# Patient Record
Sex: Female | Born: 1940 | Race: White | Hispanic: No | Marital: Married | Smoking: Never smoker
Health system: Southern US, Community
[De-identification: ages and names within clinical notes are randomized; demographics above are authoritative.]

## PROBLEM LIST (undated history)

## (undated) DIAGNOSIS — I10 Essential (primary) hypertension: Secondary | ICD-10-CM

## (undated) DIAGNOSIS — Z0000 Encounter for general adult medical examination without abnormal findings: Secondary | ICD-10-CM

## (undated) DIAGNOSIS — C50919 Malignant neoplasm of unspecified site of unspecified female breast: Secondary | ICD-10-CM

## (undated) HISTORY — DX: Encounter for general adult medical examination without abnormal findings: Z00.00

## (undated) HISTORY — PX: TONSILLECTOMY: SUR1361

## (undated) HISTORY — DX: Essential (primary) hypertension: I10

## (undated) HISTORY — DX: Malignant neoplasm of unspecified site of unspecified female breast: C50.919

## (undated) HISTORY — PX: MASTECTOMY MODIFIED RADICAL: SUR848

---

## 1999-11-19 ENCOUNTER — Other Ambulatory Visit: Admission: RE | Admit: 1999-11-19 | Discharge: 1999-11-19 | Payer: Self-pay | Admitting: *Deleted

## 2003-09-29 ENCOUNTER — Emergency Department (HOSPITAL_COMMUNITY): Admission: EM | Admit: 2003-09-29 | Discharge: 2003-09-29 | Payer: Self-pay | Admitting: Emergency Medicine

## 2003-10-05 ENCOUNTER — Inpatient Hospital Stay (HOSPITAL_COMMUNITY): Admission: RE | Admit: 2003-10-05 | Discharge: 2003-10-06 | Payer: Self-pay | Admitting: Orthopedic Surgery

## 2004-04-23 ENCOUNTER — Other Ambulatory Visit: Admission: RE | Admit: 2004-04-23 | Discharge: 2004-04-23 | Payer: Self-pay | Admitting: *Deleted

## 2004-06-11 ENCOUNTER — Ambulatory Visit: Payer: Self-pay | Admitting: Internal Medicine

## 2005-05-28 ENCOUNTER — Ambulatory Visit: Payer: Self-pay | Admitting: Internal Medicine

## 2005-12-16 ENCOUNTER — Ambulatory Visit: Payer: Self-pay | Admitting: Internal Medicine

## 2006-05-18 ENCOUNTER — Ambulatory Visit: Payer: Self-pay | Admitting: Internal Medicine

## 2007-07-24 ENCOUNTER — Telehealth: Payer: Self-pay | Admitting: Internal Medicine

## 2007-11-27 ENCOUNTER — Ambulatory Visit: Payer: Self-pay | Admitting: Internal Medicine

## 2007-11-27 DIAGNOSIS — C50919 Malignant neoplasm of unspecified site of unspecified female breast: Secondary | ICD-10-CM | POA: Insufficient documentation

## 2007-11-27 DIAGNOSIS — I10 Essential (primary) hypertension: Secondary | ICD-10-CM | POA: Insufficient documentation

## 2007-11-27 DIAGNOSIS — Z853 Personal history of malignant neoplasm of breast: Secondary | ICD-10-CM | POA: Insufficient documentation

## 2007-11-27 LAB — CONVERTED CEMR LAB
Basophils Absolute: 0 10*3/uL (ref 0.0–0.1)
Calcium: 9.3 mg/dL (ref 8.4–10.5)
Cholesterol: 219 mg/dL (ref 0–200)
Direct LDL: 141.4 mg/dL
Eosinophils Absolute: 0.1 10*3/uL (ref 0.0–0.7)
GFR calc Af Amer: 81 mL/min
GFR calc non Af Amer: 67 mL/min
HCT: 41.4 % (ref 36.0–46.0)
MCHC: 34.2 g/dL (ref 30.0–36.0)
MCV: 93.7 fL (ref 78.0–100.0)
Monocytes Absolute: 0.4 10*3/uL (ref 0.1–1.0)
Monocytes Relative: 8.9 % (ref 3.0–12.0)
Neutro Abs: 2.4 10*3/uL (ref 1.4–7.7)
Platelets: 339 10*3/uL (ref 150–400)
Potassium: 3.9 meq/L (ref 3.5–5.1)
RDW: 12.4 % (ref 11.5–14.6)
Sodium: 149 meq/L — ABNORMAL HIGH (ref 135–145)
TSH: 1.56 microintl units/mL (ref 0.35–5.50)
Total CHOL/HDL Ratio: 3.2
Triglycerides: 76 mg/dL (ref 0–149)

## 2007-11-28 ENCOUNTER — Encounter: Payer: Self-pay | Admitting: Internal Medicine

## 2007-11-29 ENCOUNTER — Telehealth: Payer: Self-pay | Admitting: Internal Medicine

## 2008-01-01 ENCOUNTER — Telehealth: Payer: Self-pay | Admitting: Internal Medicine

## 2008-01-22 ENCOUNTER — Ambulatory Visit: Payer: Self-pay | Admitting: Gastroenterology

## 2008-02-05 ENCOUNTER — Ambulatory Visit: Payer: Self-pay | Admitting: Gastroenterology

## 2008-05-31 ENCOUNTER — Ambulatory Visit: Payer: Self-pay | Admitting: Internal Medicine

## 2009-01-27 ENCOUNTER — Ambulatory Visit: Payer: Self-pay | Admitting: Internal Medicine

## 2009-01-27 DIAGNOSIS — E785 Hyperlipidemia, unspecified: Secondary | ICD-10-CM | POA: Insufficient documentation

## 2009-01-27 LAB — CONVERTED CEMR LAB
Calcium: 9.1 mg/dL (ref 8.4–10.5)
Creatinine, Ser: 0.8 mg/dL (ref 0.4–1.2)
GFR calc non Af Amer: 76.13 mL/min (ref >60)
HDL: 61.5 mg/dL (ref >39.00)
Sodium: 146 meq/L — ABNORMAL HIGH (ref 135–145)
TSH: 1.68 microintl units/mL (ref 0.35–5.50)
Triglycerides: 91 mg/dL (ref 0.0–149.0)

## 2009-01-29 ENCOUNTER — Telehealth: Payer: Self-pay | Admitting: Internal Medicine

## 2009-05-15 ENCOUNTER — Ambulatory Visit: Payer: Self-pay | Admitting: Internal Medicine

## 2009-08-28 ENCOUNTER — Encounter: Payer: Self-pay | Admitting: Internal Medicine

## 2009-09-29 ENCOUNTER — Telehealth: Payer: Self-pay | Admitting: Internal Medicine

## 2010-09-01 NOTE — Progress Notes (Signed)
    Preventive Care Screening  Bone Density:    Date:  08/28/2009    Results:  normal

## 2010-09-18 ENCOUNTER — Ambulatory Visit (INDEPENDENT_AMBULATORY_CARE_PROVIDER_SITE_OTHER): Payer: Medicare Other | Admitting: Internal Medicine

## 2010-09-18 ENCOUNTER — Encounter (INDEPENDENT_AMBULATORY_CARE_PROVIDER_SITE_OTHER): Payer: Self-pay | Admitting: *Deleted

## 2010-09-18 ENCOUNTER — Encounter: Payer: Self-pay | Admitting: Internal Medicine

## 2010-09-18 ENCOUNTER — Other Ambulatory Visit: Payer: Self-pay | Admitting: Internal Medicine

## 2010-09-18 ENCOUNTER — Other Ambulatory Visit: Payer: Medicare Other

## 2010-09-18 DIAGNOSIS — Z2911 Encounter for prophylactic immunotherapy for respiratory syncytial virus (RSV): Secondary | ICD-10-CM

## 2010-09-18 DIAGNOSIS — E785 Hyperlipidemia, unspecified: Secondary | ICD-10-CM

## 2010-09-18 DIAGNOSIS — I10 Essential (primary) hypertension: Secondary | ICD-10-CM

## 2010-09-18 DIAGNOSIS — Z0000 Encounter for general adult medical examination without abnormal findings: Secondary | ICD-10-CM

## 2010-09-18 DIAGNOSIS — Z23 Encounter for immunization: Secondary | ICD-10-CM

## 2010-09-18 LAB — LDL CHOLESTEROL, DIRECT: Direct LDL: 133.8 mg/dL

## 2010-09-18 LAB — LIPID PANEL
HDL: 69.6 mg/dL (ref >39.00)
Triglycerides: 127 mg/dL (ref 0.0–149.0)
VLDL: 25.4 mg/dL (ref 0.0–40.0)

## 2010-09-18 LAB — BASIC METABOLIC PANEL
BUN: 15 mg/dL (ref 6–23)
Calcium: 9.4 mg/dL (ref 8.4–10.5)
GFR: 69.69 mL/min (ref >60.00)
Glucose, Bld: 86 mg/dL (ref 70–99)
Potassium: 5 mEq/L (ref 3.5–5.1)
Sodium: 141 mEq/L (ref 135–145)

## 2010-09-29 NOTE — Assessment & Plan Note (Signed)
Summary: yearly wellness exam/fasting   Vital Signs:  Patient profile:   70 year old female Height:      61.50 inches Weight:      162.50 pounds BMI:     30.32 O2 Sat:      98 % on Room air Temp:     98.2 degrees F oral Pulse rate:   78 / minute Resp:     14 per minute BP sitting:   158 / 92  (right arm) Cuff size:   regular  Vitals Entered By: Burnard Leigh CMA(AAMA) (September 18, 2010 10:42 AM)  O2 Flow:  Room air CC: Yearly wellness exam/medicare..sls,cma Is Patient Diabetic? No Comments Pt would like to discuss Zostavax/sls,cma   Primary Care Provider:  [facility]  CC:  Yearly wellness exam/medicare..sls and cma.  History of Present Illness: Sharon Zavala presenting for  medicare wellness exam. She saw her gynecologist, Esperanza Richters, on Valentine's Day - and had a normal exam: pap smear, breast exam - s/p mastectomy with prosthesis.  She is feeling well except for two episdoes of blurred vision that lasted 10-15 minutes resolving with rest.   She is 100% independent in all activities of daily living. No falls or increased risk of falls. She did fall in '05 but not since. She is cognitively intact: socially acitve, running the household and paying the bills. No signs or symptoms of depression.   Preventive Screening-Counseling & Management  Alcohol-Tobacco     Alcohol drinks/day: 0     Smoking Status: never  Caffeine-Diet-Exercise     Caffeine use/day: 3 cups daily     Diet Comments: regular     Does Patient Exercise: yes     Type of exercise: walk     Exercise (avg: min/session): <30     Times/week: 4  Hep-HIV-STD-Contraception     Hepatitis Risk: no risk noted     HIV Risk: no risk noted     STD Risk: no risk noted     Dental Visit-last 6 months yes     Sun Exposure-Excessive: yes  Safety-Violence-Falls     Seat Belt Use: yes     Helmet Use: n/a     Firearms in the Home: firearms in the home     Smoke Detectors: yes     Violence in the Home: not  applicable     Sexual Abuse: no     Fall Risk: no      Sexual History:  currently monogamous.        Drug Use:  never.        Blood Transfusions:  no.    Current Medications (verified): 1)  Multivitamins   Tabs (Multiple Vitamin) .... Take 1 Tablet By Mouth Once A Day 2)  Calcium-D 600-200 Mg-Unit  Tabs (Calcium Carbonate-Vitamin D) .... Take 1 Tablet By Mouth Once A Day 3)  Enalapril-Hydrochlorothiazide 5-12.5 Mg  Tabs (Enalapril-Hydrochlorothiazide) .Marland Kitchen.. 1 By Mouth Once Daily 4)  Klor-Con 8 Meq  Tbcr (Potassium Chloride) .Marland Kitchen.. 1 By Mouth Once Daily 5)  Adult Aspirin Low Strength 81 Mg  Tbdp (Aspirin) .... 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 '89 Mastectomy-modified radical right with saline implant reconst. '  72   G1P1  Family History: Last updated: 12/13/2007 Father- deceased @88 : Prostate cancer, HTN Mother - deceased @96 : natural sister- breast cancer Neg- colon cancer, DM, CAD  Social History: Last updated: Dec 13, 2007 [employer] college- BA english married '67 1 daughter - '68: 1 grandchild Lives with husband - independently. He has made a good recovery from prostate surgery- '09  Social History: Caffeine use/day:  3 cups daily Does Patient Exercise:  yes Fall Risk:  no Seat Belt Use:  yes Dental Care w/in 6 mos.:  yes Hepatitis Risk:  no risk noted HIV Risk:  no risk noted STD Risk:  no risk noted Sun Exposure-Excessive:  yes Sexual History:  currently monogamous Drug Use:  never Blood Transfusions:  no  Review of Systems       The patient  complains of suspicious skin lesions.  The patient denies anorexia, fever, weight loss, weight gain, vision loss, decreased hearing, hoarseness, chest pain, dyspnea on exertion, peripheral edema, prolonged cough, headaches, abdominal pain, severe indigestion/heartburn, hematuria, incontinence, genital sores, muscle weakness, difficulty walking, depression, unusual weight change, abnormal bleeding, enlarged lymph nodes, angioedema, and breast masses.         concerned for macular skin darkening at the right jaw.   Physical Exam  General:  Well-developed,well-nourished,in no acute distress; alert,appropriate and cooperative throughout examination Head:  Normocephalic and atraumatic without obvious abnormalities. No apparent alopecia or balding. Eyes:  No corneal or conjunctival inflammation noted. EOMI. Perrla. Funduscopic exam benign, without hemorrhages, exudates or papilledema. Vision grossly normal. Ears:  External ear exam shows no significant lesions or deformities.  Otoscopic examination reveals clear canals, tympanic membranes are intact bilaterally without bulging, retraction, inflammation or discharge. Hearing is grossly normal bilaterally. Nose:  no external deformity and no external erythema.   Mouth:  Oral mucosa and oropharynx without lesions or exudates.  Teeth in good repair. Neck:  supple, full ROM, no thyromegaly, and no carotid bruits.   Breasts:  deferred to oncology and general surgery: s/p bilateral mastectomies Lungs:  Normal respiratory effort, chest expands symmetrically. Lungs are clear to auscultation, no crackles or wheezes. Heart:  Normal rate and regular rhythm. S1 and S2 normal without gallop, murmur, click, rub or other extra sounds. Abdomen:  soft, non-tender, no distention, no guarding, and no hepatomegaly.   Genitalia:  deferred to gyn Msk:  normal ROM, no joint tenderness, no joint swelling, no joint warmth, no redness over joints, and no joint instability.     Pulses:  2+ radial and DP pulses Extremities:  No clubbing, cyanosis, edema, or deformity noted with normal full range of motion of all joints.   Neurologic:  alert & oriented X3, cranial nerves II-XII intact, strength normal in all extremities, gait normal, and DTRs symmetrical and normal.   Skin:  turgor normal, color normal, and no rashes.   Cervical Nodes:  no anterior cervical adenopathy and no posterior cervical adenopathy.   Psych:  Oriented X3, memory intact for recent and remote, normally interactive, and good eye contact.     Impression & Recommendations:  Problem # 1:  HYPERLIPIDEMIA (ICD-272.4) Due for lab with recommendations to follow.  Orders: TLB-Lipid Panel (80061-LIPID)  Addendum: HDL is robust and protectve; LDL is just above goal of 161 or less (NCEP ATP III)  Plan - a prudent diet and exercise.  Problem # 2:  UNSPECIFIED ESSENTIAL HYPERTENSION (ICD-401.9)  Her updated medication list for this problem includes:    Enalapril-hydrochlorothiazide 5-12.5 Mg Tabs (Enalapril-hydrochlorothiazide) .Marland Kitchen... 1 by mouth  once daily  Orders: TLB-BMP (Basic Metabolic Panel-BMET) (80048-METABOL)  BP today: 158/92 Prior BP: 112/84 (01/27/2009)  Elevated BP at today's visit. Previous readings were excellent.  PLan - home monitoring of Blood Pressure: report back, escpecially if systolic BP is consistently 140+.  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 She is doing great with no signs of recurrent disease.  Problem # 4:  Preventive Health Care (ICD-V70.0) Bengin interval history. Physical exam is normal except for elevation in BP. Lab results are within normal limits. She is current with colorectal cancer screening with last study July '09, due for follow-up in 2019. Immunizations: Tetnus and shingles vaccine administered today; Pneumonia vaccine June '10; Fl Oct '11. 12 lead EKG without signs of  ischemia or injury.   In summary- a very nice woman who appears to be medically stable. She will return in one year or as needed. She will report back out-of-office blood pressure readings, particularly if SBP is 140+ on a regular basis.   Complete Medication List: 1)  Multivitamins Tabs (Multiple vitamin) .... Take 1 tablet by mouth once a day 2)  Calcium-d 600-200 Mg-unit Tabs (Calcium carbonate-vitamin d) .... Take 1 tablet by mouth once a day 3)  Enalapril-hydrochlorothiazide 5-12.5 Mg Tabs (Enalapril-hydrochlorothiazide) .Marland Kitchen.. 1 by mouth once daily 4)  Klor-con 8 Meq Tbcr (Potassium chloride) .Marland Kitchen.. 1 by mouth once daily 5)  Adult Aspirin Low Strength 81 Mg Tbdp (Aspirin) .... Take 1 tablet by mouth once a day  Other Orders: Tdap => 59yrs IM (78469) Admin 1st Vaccine (62952) Zoster (Shingles) Vaccine Live [phone]) Admin of Any Addtl Vaccine (44010) Medicare -1st Annual Wellness Visit [id number])   Patient: Sharon Zavala Note: All result statuses are Final unless otherwise noted.  Tests: (1) Lipid Panel (LIPID)   Cholesterol          [H]  224 mg/dL                   6-644     ATP III Classification            Desirable:  < 200 mg/dL                    Borderline High:  200 - 239 mg/dL               High:  > = 240 mg/dL   Triglycerides             127.0 mg/dL                 0.3-474.2     Normal:  <150 mg/dL     Borderline High:  595 - 199 mg/dL   HDL                       63.87 mg/dL                 >56.43   VLDL Cholesterol          25.4 mg/dL                  3.2-95.1  CHO/HDL Ratio:  CHD Risk                             3  Men          Women     1/2 Average Risk     3.4          3.3     Average Risk          5.0          4.4     2X Average Risk          9.6          7.1     3X Average Risk          15.0          11.0                           Tests: (2) BMP (METABOL)   Sodium                    141 mEq/L                   135-145   Potassium                  5.0 mEq/L                   3.5-5.1   Chloride                  104 mEq/L                   96-112   Carbon Dioxide            31 mEq/L                    19-32   Glucose                   86 mg/dL                    16-10   BUN                       15 mg/dL                    9-60   Creatinine                0.9 mg/dL                   4.5-4.0   Calcium                   9.4 mg/dL                   9.8-11.9   GFR                       69.69 mL/min                >60.00  Tests: (3) Cholesterol LDL - Direct (DIRLDL)  Cholesterol LDL - Direct                             133.8 mg/dL     Optimal:  <147 mg/dL     Near or Above Optimal:  100-129 mg/dL     Borderline High:  829-562 mg/dL     High:  130-865 mg/dL  Very High:  >190 mg/dL  Orders Added: 1)  Tdap => 59yrs IM [90715] 2)  Admin 1st Vaccine [90471] 3)  Zoster (Shingles) Vaccine Live [90736] 4)  Admin of Any Addtl Vaccine [90472] 5)  Medicare -1st Annual Wellness Visit [G0438] 6)  Est. Patient Level II [16109] 7)  TLB-Lipid Panel [80061-LIPID] 8)  TLB-BMP (Basic Metabolic Panel-BMET) [80048-METABOL]   Immunization History:  Influenza Immunization History:    Influenza:  historical (05/02/2010)  Immunizations Administered:  Tetanus Vaccine:    Vaccine Type: Tdap    Site: right deltoid    Mfr: GlaxoSmithKline    Dose: 0.5 ml    Route: IM    Given by: Burnard Leigh CMA(AAMA)    Exp. Date: 05/21/2012    Lot #: [id number]    VIS given: 06/19/08 version given September 18, 2010.  Zostavax # 1:    Vaccine Type: Zostavax    Site: right deltoid    Mfr: Merck    Dose: 0.5 ml    Route: [place]    Given by: Burnard Leigh CMA(AAMA)    Exp. Date: 03/20/2011    Lot #: [id number]    VIS given: 05/14/05 given September 18, 2010.   Immunization History:  Influenza Immunization History:    Influenza:  Historical (05/02/2010)  Immunizations Administered:  Tetanus Vaccine:    Vaccine Type: Tdap    Site: right deltoid     Mfr: GlaxoSmithKline    Dose: 0.5 ml    Route: IM    Given by: Burnard Leigh CMA(AAMA)    Exp. Date: 05/21/2012    Lot #: [id number]    VIS given: 06/19/08 version given September 18, 2010.  Zostavax # 1:    Vaccine Type: Zostavax    Site: right deltoid    Mfr: Merck    Dose: 0.5 ml    Route: [place]    Given by: Burnard Leigh CMA(AAMA)    Exp. Date: 03/20/2011    Lot #: [id number]    VIS given: 05/14/05 given September 18, 2010.

## 2010-12-18 NOTE — Op Note (Signed)
NAMEKANON, COLUNGA                       ACCOUNT NO.:  [id number]   MEDICAL RECORD NO.:  [id number]                   PATIENT TYPE:  INP   LOCATION:  5005                                 FACILITY:  [facility]   PHYSICIAN:  Dionne Ano. Everlene Other, M.D.         DATE OF BIRTH:  09/04/1940   DATE OF PROCEDURE:  10/04/2003  DATE OF DISCHARGE:  10/06/2003                                 OPERATIVE REPORT   Dictator wants a copy of the previously dictated report for this patient  sent to his office in [place] and Dr. Jene Every at [facility].                                               Dionne Ano. Everlene Other, M.D.    Nash Mantis  D:  10/05/2003  T:  10/06/2003  Job:  [id number]   cc:   Jene Every, M.D.  [address]  [place]  [place] [address]  Fax: [phone]

## 2010-12-18 NOTE — Op Note (Signed)
Sharon, Zavala                       ACCOUNT NO.:  [id number]   MEDICAL RECORD NO.:  [id number]                   PATIENT TYPE:  INP   LOCATION:  5005                                 FACILITY:  [facility]   PHYSICIAN:  Dionne Ano. Everlene Other, M.D.         DATE OF BIRTH:  1941/06/27   DATE OF PROCEDURE:  DATE OF DISCHARGE:  10/06/2003                                 OPERATIVE REPORT   PREOPERATIVE DIAGNOSES:  Terrible triad-type elbow fracture, right upper  extremity with marked displacement and comminution of the radial head  fracture with dislocation of the radial head and comminuted olecranon  fracture.   POSTOPERATIVE DIAGNOSES:  Terrible triad-type elbow fracture, right upper  extremity with marked displacement and comminution of the radial head  fracture with dislocation of the radial head and comminuted olecranon  fracture.   PROCEDURE:  1. Open reduction internal fixation with congruent plate system, right     olecranon fracture.  2. Radial head excision, right elbow.  3. Arthroplasty right elbow radial head with 20 small head and size 1 stem     from [employer].  4. Stress radiography.  5. Repair lateral ulnar collateral ligament complex, right elbow.   SURGEON:  Dionne Ano. Amanda Pea, M.D.   ASSISTANT:  Dr. Jene Every.   ANESTHESIA:  General.   DRAINS:  One.   COMPLICATIONS:  None.   INDICATIONS FOR THE PROCEDURE:  This patient is a very pleasant female who  presents with the above-mentioned diagnosis.  She has an injury mechanism of  a terrible triad fracture of the elbow with complete dislocation of the  radial head, comminuted olecranon fracture and lateral elbow instability.  I  have discussed the risks and benefits of the operative procedure in detail  and all questions have been encouraged and answered preoperatively.  The  operative risks include death, infection, anesthesia, damage to normal  structures, failure of surgery to accomplish  its intended goals, __________  return to function, heterotopic ossification, stiffness, chronic pain and  dystrophy as some of the major complicating features which can occur.  I  have also discussed her possible need for hardware removal, revision, etc.  With this in mind, she is asked to proceed.   OPERATIVE PROCEDURE IN DETAIL:  The patient received satisfactory general  anesthesia, taken to the operative suite, was counseled in the holding area.  The correct extremity to be operated on was identified.  She was then taken  to the operative suite, underwent a general anesthetic, was well padded,  appropriately positioned in the semi lateral position.  A U-drape was placed  and I carefully removed the splint, applied a tourniquet and then performed  Betadine scrub and paint, 10 minute nature.  Following this, a sterile field  was secured.  Ancef was given preoperatively.  Once this was done, the arm  was elevated.  The tourniquet was used to inflate to 250 mmHg and a  posterior  utilitarian incision was made.  Flaps were created.  A large flap  was swung laterally about the elbow to access the Kocher interval and  posteriorly I could access the ulna.  With this performed, I then incised  the periosteum overlying the olecranon and ulna and identified the fracture  fragments.  The fracture fragments were treated with debridement, curettage,  irrigation and provisional fixation was then obtained followed by the  application of an [employer] congruent bone plate for the ulna.  I was able to  achieve purchase under live fluoroscopy into the coronoid piece.  This plate  reduced the fragments nicely.  I was able to get three screws into the  fragment that harnessed the coronoid anteriorly.  I was able to get at least  six cortices distal to this region as well and I was extremely pleased with  the plate and screw construct.  I then placed the arm under live fluoroscopy  and was pleased with the  stability.  Following this, we then accessed the  Kocher interval between the anconeus and ACU, opened this up and identified  the radial head which was extruded in the soft tissues.  I removed the  radial head, obtained bone graft from the radial head and then placed this  in the olecranon fracture site.  The radial head was examined and it was  felt intraoperatively that the patient would be best served with a radial  head prosthesis.  She did have elements of posterior lateral instability on  posterior pivot shift test in the operative suite as demonstrated by  supinating the arm and allowing range of motion to occur into extension.  When this was performed, the patient had a lateral instability.  Thus, it  was apparent the patient needed a radial head prosthesis as well as repair  of the lateral ulnar collateral ligament/tightening of this structure.  At  this point in time, I then prepared the radial head prosthesis by sizing the  patient to a small stem length and then making an oscillating saw cut under  protection, keeping the arm pronated to avoid injury to the posterior  interosseous nerve.  This was done to my satisfaction without difficulty.  A  small stem size was chosen as the patient had a fairly small canal.  The  patient's radial shaft was fairly mobile and the annular ligament was  released during the dissection and was attenuated with the injury, of  course.  It was of course, later repaired.  With the ascension trial  prosthesis, I felt the patient would be most secure with the small stem and  smallest head, which corresponded to the head removed from the patient per  templating intraoperatively.  Following this, the radial shaft underwent  preparation with broaching followed by placement of a size 1 stem and then  application of a 20 S head.  This reduced nicely.  I was pleased with this  and the findings.  The prosthesis sat down on the bone well and there were no  complicating features.  At this point in time, I performed a stress  radiography.  I was pleased with the range of motion and stability.  I then  performed a repair of the annular ligament and I shored up the lateral ulnar  collateral ligament complex with 2-0 Fib[place]ire to my satisfaction without  difficulty.  This created extra stability.  I then performed a  posterolateral pivot shift test in the operative suite and was pleased  with  the stability.  She was stable to full extension and full flexion and was  stable in supination as well however, we will of course, keep her in a  pronated position during the early postoperative period.  Stress radiography  was performed and final copy x-rays were made, documenting the correct  position of the prostheses and the olecranon implant.  I then placed radial  head bone graft harvested from the radial head excised into the olecranon  fracture site.  I then repaired the anconeus interval with 0 Vicryl about  the anconeus ACU interval and then repaired the periosteum over the ulna and  olecranon to my satisfaction without difficulty.  Following this, the  tourniquet was deflated.  Hemostasis was obtained.  Copious irrigation was  applied and I should note irrigation was applied during multiple portions  throughout the procedure.  While the patient had the radial head  arthroplasty performed, we did copiously irrigate the joint to try and  prevent heterotopic ossification.  We were also very careful with the  oscillating saw so as to not spread any bone dust.  I irrigated the joint  copiously before, during and after the radial head arthroplasty implantation  to try and prevent heterotopic ossification and also had the patient on  prophylactic Indocin.  I have discussed with the patient the need for this  and possible heterotopic ossification risks.   Once this was done and the bone grafting was completed about the olecranon,  the patient then had an  0.8 inch/medium Hemovac drain placed.  This was  hooked up to suction.  The patient then had the posterior incision closed  with 2-0 Vicryl followed by a staple gun at the skin edge.  Marcaine with  epinephrine was placed in the wound for postoperative analgesia and she had  a long arm plaster splint placed with the forearm in pronated position and  the elbow at 90 to 80 degrees of flexion.  She tolerated this well, had  excellent refill of soft compartments, was transferred to the recovery room  in stable condition.  Once in the recovery room, her neurovascular  examination was excellent.  She had good extension of the fingers and no  complicating features.  I have discussed all findings with the family.  We  will proceed accordingly with postoperative measures including IV  antibiotics, pain management.  We will begin therapy at 8-10 days with full  flexion and extension to 30 degrees with the forearm in a pronated position for the first two to three weeks and then we will move her into a  gradual neutral position and then into supination after four weeks.  I have  discussed with the family these issues, postoperative measures, etc.  It has  been a pleasure to participate in her care and we look forward to  participating in her postoperative recovery.                                               Dionne Ano. Everlene Other, M.D.    Sharon Zavala  D:  10/05/2003  T:  10/07/2003  Job:  [id number]

## 2011-02-26 ENCOUNTER — Other Ambulatory Visit: Payer: Self-pay | Admitting: Internal Medicine

## 2011-05-30 ENCOUNTER — Other Ambulatory Visit: Payer: Self-pay | Admitting: Internal Medicine

## 2011-05-31 NOTE — Telephone Encounter (Signed)
Done

## 2011-09-21 ENCOUNTER — Encounter: Payer: Self-pay | Admitting: Internal Medicine

## 2011-09-21 ENCOUNTER — Ambulatory Visit (INDEPENDENT_AMBULATORY_CARE_PROVIDER_SITE_OTHER): Payer: Medicare Other | Admitting: Internal Medicine

## 2011-09-21 ENCOUNTER — Other Ambulatory Visit (INDEPENDENT_AMBULATORY_CARE_PROVIDER_SITE_OTHER): Payer: Medicare Other

## 2011-09-21 VITALS — BP 128/82 | HR 81 | Temp 97.8°F | Resp 16 | Ht 62.0 in | Wt 163.8 lb

## 2011-09-21 DIAGNOSIS — E785 Hyperlipidemia, unspecified: Secondary | ICD-10-CM

## 2011-09-21 DIAGNOSIS — I10 Essential (primary) hypertension: Secondary | ICD-10-CM

## 2011-09-21 DIAGNOSIS — Z0000 Encounter for general adult medical examination without abnormal findings: Secondary | ICD-10-CM

## 2011-09-21 DIAGNOSIS — C50919 Malignant neoplasm of unspecified site of unspecified female breast: Secondary | ICD-10-CM

## 2011-09-21 LAB — COMPREHENSIVE METABOLIC PANEL
Alkaline Phosphatase: 82 U/L (ref 39–117)
CO2: 30 mEq/L (ref 19–32)
Creatinine, Ser: 0.9 mg/dL (ref 0.4–1.2)
GFR: 65.1 mL/min (ref >60.00)
Glucose, Bld: 91 mg/dL (ref 70–99)
Sodium: 142 mEq/L (ref 135–145)
Total Bilirubin: 0.6 mg/dL (ref 0.3–1.2)

## 2011-09-21 MED ORDER — POTASSIUM CHLORIDE ER 8 MEQ PO TBCR: 8.0000 meq | EXTENDED_RELEASE_TABLET | Freq: Every day | ORAL | Status: DC

## 2011-09-21 MED ORDER — ENALAPRIL-HYDROCHLOROTHIAZIDE 5-12.5 MG PO TABS: 1.0000 | ORAL_TABLET | Freq: Every day | ORAL | Status: DC

## 2011-09-21 NOTE — Progress Notes (Signed)
Subjective:    Patient ID: Sharon Zavala, female    DOB: [birth year], 71 y.o.   MRN: [id number]  HPI The patient is here for annual Medicare wellness examination and management of other chronic and acute problems. She reports that she is doing well and feels "great."   The risk factors are reflected in the social history.  The roster of all physicians providing medical care to patient - is listed in the Snapshot section of the chart.  Activities of daily living:  The patient is 100% inedpendent in all ADLs: dressing, toileting, feeding as well as independent mobility  Home safety : The patient has smoke detectors in the home. Falls - house is fall safe. Grab bars are recommended. They wear seatbelts. No firearms at homeThere is no violence in the home.   There is no risks for hepatitis, STDs or HIV. There is no history of blood transfusion. They have no travel history to infectious disease endemic areas of the world.  The patient has seen their dentist in the last six month. They have seen their eye doctor in the last year. They deny any hearing difficulty and have not had audiologic testing in the last year.  They do not  have excessive sun exposure. Discussed the need for sun protection: hats, long sleeves and use of sunscreen if there is significant sun exposure.   Diet: the importance of a healthy diet is discussed. They do have a healthy diet.  The patient has a regular exercise program: walking ,  1 hr duration, 5-7 per week.  The benefits of regular aerobic exercise were discussed.  Depression screen: there are no signs or vegative symptoms of depression- irritability, change in appetite, anhedonia, sadness/tearfullness.  Cognitive assessment: the patient manages all their financial and personal affairs and is actively engaged.  The following portions of the patient's history were reviewed and updated as appropriate: allergies, current medications, past family history, past  medical history,  past surgical history, past social history  and problem list.  Vision, hearing, body mass index were assessed and reviewed.   During the course of the visit the patient was educated and counseled about appropriate screening and preventive services including : fall prevention , diabetes screening, nutrition counseling, colorectal cancer screening, and recommended immunizations.  Past Medical History  Diagnosis Date  . 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 Unspecified essential hypertension   . Routine general medical examination at a health care facility    Past Surgical History  Procedure Date  . Tonsillectomy   . Mastectomy modified radical '89 & '97    Bilateral   Family History  Problem Relation Age of Onset  . Prostate cancer Father 10    deceased  . Other Mother 3    natural causes  . Breast cancer Sister   . Colon cancer Neg Hx   . Diabetes Neg Hx   . Coronary artery disease Neg Hx    History   Social History  . Marital Status: Married    Spouse Name: N/A    Number of Children: N/A  . Years of Education: N/A   Occupational History  . Not on file.   Social History Main Topics  . Smoking status: Never Smoker   . Smokeless tobacco: Not on file  . Alcohol Use: Not on file  . Drug Use: Not on file  . Sexually Active: Not on file   Other Topics Concern  . Not on file  Social History Narrative   [employer]BA EnglishMarried '671 daughter - '68; 1 grandchildLives with husband - independently ; he has made a good recovery from prostate surgery-'09    Current Outpatient Prescriptions on File Prior to Visit  Medication Sig Dispense Refill  . KLOR-CON 8 MEQ CR tablet TAKE ONE (1) TABLET(S) BY MOUTH ONCE A DAY  90 tablet  0   No current outpatient prescriptions on file prior to visit.      Review of Systems Constitutional:  Negative for fever, chills, activity change and unexpected weight change.  HEENT:   Negative for hearing loss, ear pain, congestion, neck stiffness and postnasal drip. Negative for sore throat or swallowing problems. Negative for dental complaints.   Eyes: Negative for vision loss or change in visual acuity.  Respiratory: Negative for chest tightness and wheezing. Negative for DOE.   Cardiovascular: Negative for chest pain or palpitations. No decreased exercise tolerance Gastrointestinal: No change in bowel habit. No bloating or gas. No reflux or indigestion Genitourinary: Negative for urgency, frequency, flank pain and difficulty urinating.  Musculoskeletal: Negative for myalgias, back pain, arthralgias and gait problem.  Neurological: Negative for dizziness, tremors, weakness and headaches.  Hematological: Negative for adenopathy.  Psychiatric/Behavioral: Negative for behavioral problems and dysphoric mood.       Objective:   Physical Exam Filed Vitals:   09/21/11 1338  BP: 128/82  Pulse: 81  Temp: 97.8 F (36.6 C)  Resp: 16  Weight: 163 lb 12 oz (74.277 kg)   Gen'l: well nourished, well developed white woman in no distress HEENT - [place]/AT, EACs/TMs normal, oropharynx with native dentition in good condition, no buccal or palatal lesions, posterior pharynx clear, mucous membranes moist. C&S clear, PERRLA, fundi - normal Neck - supple, no thyromegaly Nodes- negative submental, cervical, supraclavicular regions Chest - no deformity, no CVAT Lungs - cleat without rales, wheezes. No increased work of breathing Breast - deferred to gyn Cardiovascular - regular rate and rhythm, quiet precordium, no murmurs, rubs or gallops, 2+ radial, DP and PT pulses Abdomen - BS+ x 4, no HSM, no guarding or rebound or tenderness Pelvic - deferred to gyn Rectal - deferred to gyn Extremities - no clubbing, cyanosis, edema or deformity.  Neuro - A&O x 3, CN II-XII normal, motor strength normal and equal, DTRs 2+ and symmetrical biceps, radial, and patellar tendons. Cerebellar - no  tremor, no rigidity, fluid movement and normal gait. Derm - Head, neck, back, abdomen and extremities without suspicious lesions  Lab Results  Component Value Date   WBC 4.5 11/27/2007   HGB 14.2 11/27/2007   HCT 41.4 11/27/2007   PLT 339 11/27/2007   GLUCOSE 91 09/21/2011   CHOL 224* 09/18/2010   TRIG 127.0 09/18/2010   HDL 69.60 09/18/2010   LDLDIRECT 133.8 09/18/2010   ALT 18 09/21/2011   AST 21 09/21/2011   NA 142 09/21/2011   K 5.2* 09/21/2011   CL 105 09/21/2011   CREATININE 0.9 09/21/2011   BUN 15 09/21/2011   CO2 30 09/21/2011   TSH 1.68 01/27/2009          Assessment & Plan:

## 2011-09-24 DIAGNOSIS — Z0000 Encounter for general adult medical examination without abnormal findings: Secondary | ICD-10-CM | POA: Insufficient documentation

## 2011-09-24 NOTE — Assessment & Plan Note (Signed)
BP Readings from Last 3 Encounters:  09/21/11 128/82  09/18/10 158/92  01/27/09 112/84   Generally good control. Tolerating medication w/o adverse side effects. Lab reveals normal electrolytes and renal function.  Plan- continue present medication

## 2011-09-24 NOTE — Assessment & Plan Note (Signed)
Stable and doing well. 

## 2011-09-24 NOTE — Assessment & Plan Note (Signed)
Currently on no medications. LDL is just above goal but below the recommended treatment threshold (NCEP-ATPIII).  Plan - continue life style management: low fat diet and regular aerobic exercise at least 3 times a week.

## 2011-09-24 NOTE — Assessment & Plan Note (Signed)
Interval history - unremarkable. Physical exam is normal with Pelvic and breast exam deferred to gyn. Lab results previous and current are fine. She is current with immunizations. She is referred for follow-up colonoscopy.   IN summary - a very nice woman who is medically stable. She is encouraged to continue with her exercise and health diet. She will return as needed or in 1 year.

## 2012-06-11 ENCOUNTER — Other Ambulatory Visit: Payer: Self-pay | Admitting: Internal Medicine

## 2012-07-04 ENCOUNTER — Telehealth: Payer: Self-pay | Admitting: Internal Medicine

## 2012-07-04 NOTE — Telephone Encounter (Signed)
Needs ov Weds with me or colleague.

## 2012-07-04 NOTE — Telephone Encounter (Signed)
Patient Information:  Caller Name: Allyssia  Phone: [phone]  Patient: Sharon Zavala, Sharon Zavala  Gender: Female  DOB: 08-03-1940  Age: 71 Years  PCP: Illene Regulus (Adults only)   Symptoms  Reason For Call & Symptoms: Episodes of Chest pressure  Reviewed Health History In EMR: Yes  Reviewed Medications In EMR: Yes  Reviewed Allergies In EMR: Yes  Reviewed Surgeries / Procedures: Yes  Date of Onset of Symptoms: 04/13/2011  Treatments Tried: Took two baby aspirin  Treatments Tried Worked: Yes  Guideline(s) Used:  Chest Pain  Disposition Per Guideline:   See Today in Office  Reason For Disposition Reached:   All other patients with chest pain  Advice Given:  Call Back If:  Severe chest pain  Constant chest pain lasting longer than 5 minutes  Difficulty breathing  You become worse.  Office Follow Up:  Does the office need to follow up with this patient?: Yes  Instructions For The Office: Please follow up with work in appointment or further instruction if ok for patient to wait since currently asymptomatic.  RN Note:  Patient is having episodes of feeling like an elephant is sitting on chest first episode was in September with the third episode occuring 07/03/12 in the morning and lasted 3-4 hours. Patient states that one week prior to the first episode she had to clean house of mold and mildew where they had been away from home for a while.  Unsure if any relation to chest pain. Patient is currently asymptomatic today.

## 2012-07-05 ENCOUNTER — Telehealth: Payer: Self-pay | Admitting: *Deleted

## 2012-07-05 ENCOUNTER — Encounter: Payer: Self-pay | Admitting: Internal Medicine

## 2012-07-05 ENCOUNTER — Ambulatory Visit (INDEPENDENT_AMBULATORY_CARE_PROVIDER_SITE_OTHER): Payer: Medicare Other | Admitting: Internal Medicine

## 2012-07-05 VITALS — BP 172/92 | HR 91 | Temp 98.1°F | Resp 12 | Wt 166.1 lb

## 2012-07-05 DIAGNOSIS — R079 Chest pain, unspecified: Secondary | ICD-10-CM

## 2012-07-05 NOTE — Patient Instructions (Addendum)
Atypical chest pain that does raise concern for coronary disease. Having a mild to moderate risk factors, having a normal EKG reduces my level of acute concern but there is reason to move ahead with risk stratification with a test - Myoview Nuclear stress test.  You will be contacted about the timing of this test.

## 2012-07-05 NOTE — Progress Notes (Signed)
Subjective:    Patient ID: Sharon Zavala, female    DOB: [birth year], 71 y.o.   MRN: [id number]  HPI Mrs. Haseman has had 3 episodes of a crushing heaviness on her chest. First episode associated with home fumigation.  [place] - mild pressure that lasted 2 hrs. 3rd episode woke her up at 4:30 in the AM - very heavy pressure but no trouble breathing. Lasted about 2 hours. She does feel she has to burp a lot. Denies abdominal burning or pain, denies chest pain other than as described, no diaphoresis with chest pressure, only mild feeling of SOB with chest pressure, no radiation of discomfort to neck or arm. Cardiac risk factors: post-menopausal, mildly overweight, hypertension, hyperlipidemia. No prior h/o heart disease. No family h/o woman having early CAD/MI.  Past Medical History  Diagnosis Date  . 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 Unspecified essential hypertension   . Routine general medical examination at a health care facility    Past Surgical History  Procedure Date  . Tonsillectomy   . Mastectomy modified radical '89 & '97    Bilateral   Family History  Problem Relation Age of Onset  . Prostate cancer Father 76    deceased  . Other Mother 68    natural causes  . Breast cancer Sister   . Colon cancer Neg Hx   . Diabetes Neg Hx   . Coronary artery disease Neg Hx    History   Social History  . Marital Status: Married    Spouse Name: N/A    Number of Children: N/A  . Years of Education: N/A   Occupational History  . Not on file.   Social History Main Topics  . Smoking status: Never Smoker   . Smokeless tobacco: Not on file  . Alcohol Use: Not on file  . Drug Use: Not on file  . Sexually Active: Not on file   Other Topics Concern  . Not on file   Social History Narrative   Cyndia Skeeters EnglishMarried '671 daughter - '68; 1 grandchildLives with husband - independently ; he has made a good recovery from  prostate surgery-'09    Current Outpatient Prescriptions on File Prior to Visit  Medication Sig Dispense Refill  . aspirin 81 MG tablet Take 81 mg by mouth daily.      . Calcium Carbonate-Vit D-Min 1200-1000 MG-UNIT CHEW Chew 1 each by mouth daily.      . Enalapril-Hydrochlorothiazide 5-12.5 MG per tablet Take 1 tablet by mouth  daily  90 tablet  0  . Multiple Vitamin (MULTIVITAMIN) tablet Take 1 tablet by mouth daily.      . potassium chloride (KLOR-CON) 8 MEQ tablet Take 1 tablet by mouth  daily  90 tablet  0      Review of Systems System review is negative for any constitutional, cardiac, pulmonary, GI or neuro symptoms or complaints other than as described in the HPI.     Objective:   Physical Exam Filed Vitals:   07/05/12 1450  BP: 172/92  Pulse: 91  Temp: 98.1 F (36.7 C)  Resp: 12   Wt Readings from Last 3 Encounters:  07/05/12 166 lb 1.3 oz (75.333 kg)  09/21/11 163 lb 12 oz (74.277 kg)  09/18/10 162 lb 8 oz (73.71 kg)   Gen'l- WNWD, overweight white woman in no distress HEENT C&S clear, PERRLA Neck - supple Cor- 2+ radial pulse, no JVD, no carotid bruits.  Quiet precordium. RRR w/o m/r/g Pulm - normal respirations Abd - BS+, no guarding, no tenderness to palpation Neuro - A&O x 3, CN II-XII grossly normal, normal gait, normal strength.  12 Lead EKG - NSR and without ischemic changes, old injury or other abnormality.       Assessment & Plan:  Atypical chest pain - nice woman with moderate risk factors with three episodes over the past 14 days of marked chest pressure, including an episode that awakened her from sleep with no prior h/o cardiac disease or GI disease. Her symptoms are worrisome for atypical angina. Discussed with Dr. Hillis Range for cardiology, who was able to review EKG via EPIC.  Plan Risk stratification with Myoview Nuclear stress test  She is advised to seek immediate attention for recurrent chest pain

## 2012-07-05 NOTE — Telephone Encounter (Signed)
Pt needs an OV today if possible.

## 2012-07-06 ENCOUNTER — Ambulatory Visit (HOSPITAL_COMMUNITY): Payer: Medicare Other | Attending: Cardiology | Admitting: Radiology

## 2012-07-06 VITALS — BP 145/78 | Ht 62.0 in | Wt 161.0 lb

## 2012-07-06 DIAGNOSIS — I10 Essential (primary) hypertension: Secondary | ICD-10-CM | POA: Insufficient documentation

## 2012-07-06 DIAGNOSIS — R079 Chest pain, unspecified: Secondary | ICD-10-CM

## 2012-07-06 DIAGNOSIS — Z853 Personal history of malignant neoplasm of breast: Secondary | ICD-10-CM | POA: Insufficient documentation

## 2012-07-06 DIAGNOSIS — Z87891 Personal history of nicotine dependence: Secondary | ICD-10-CM | POA: Insufficient documentation

## 2012-07-06 DIAGNOSIS — R0789 Other chest pain: Secondary | ICD-10-CM | POA: Insufficient documentation

## 2012-07-06 MED ORDER — TECHNETIUM TC 99M SESTAMIBI GENERIC - CARDIOLITE
10.9000 | Freq: Once | INTRAVENOUS | Status: AC | PRN
  Administered 2012-07-06: 10.9 via INTRAVENOUS

## 2012-07-06 MED ORDER — TECHNETIUM TC 99M SESTAMIBI GENERIC - CARDIOLITE
33.0000 | Freq: Once | INTRAVENOUS | Status: AC | PRN
  Administered 2012-07-06: 33 via INTRAVENOUS

## 2012-07-06 NOTE — Progress Notes (Signed)
[facility] SITE 3 NUCLEAR MED [address] [id number] [place] [address] [phone]  Cardiology Nuclear Med Study  Sharon Zavala is a 71 y.o. female     MRN : [id number]     DOB: 1941-07-18  Procedure Date: 07/06/2012  Nuclear Med Background Indication for Stress Test:  Evaluation for Ischemia History:  H/O Breast Ca Cardiac Risk Factors: History of Smoking and Hypertension  Symptoms:  Chest Pressure.  (last date of chest discomfort 07/03/12)   Nuclear Pre-Procedure Caffeine/Decaff Intake:  12 noon patient had 2 cups of decaffeinated coffee NPO After: 6:30 pm   Lungs:  clear O2 Sat: 96% on room air. IV 0.9% NS with Angio Cath:  24g  IV Site: R Wrist  IV Started by:  Bonnita Levan, RN  Chest Size (in):  36 Cup Size: B  Height: 5\' 2"  (1.575 m)  Weight:  161 lb (73.029 kg)  BMI:  Body mass index is 29.45 kg/(m^2). Tech Comments:  BP/IV R arm only, hx of Breast Cancer    Nuclear Med Study 1 or 2 day study: 1 day  Stress Test Type:  Stress  Reading MD: Olga Millers, MD  Order Authorizing Provider:  Illene Regulus, MD  Resting Radionuclide: Technetium 17m Sestamibi  Resting Radionuclide Dose: 10.9 mCi   Stress Radionuclide:  Technetium 57m Sestamibi  Stress Radionuclide Dose: 33.0 mCi           Stress Protocol Rest HR: 74 Stress HR: 160  Rest BP: 145/78 Stress BP: 174/78  Exercise Time (min): 6:00 METS: 7.0   Predicted Max HR: 150 bpm % Max HR: 106.67 bpm Rate Pressure Product: 01027   Dose of Adenosine (mg):  n/a Dose of Lexiscan: n/a mg  Dose of Atropine (mg): n/a Dose of Dobutamine: n/a mcg/kg/min (at max HR)  Stress Test Technologist: Milana Na, EMT-P  Nuclear Technologist:  Domenic Polite, CNMT     Rest Procedure:  Myocardial perfusion imaging was performed at rest 45 minutes following the intravenous administration of Technetium 57m Sestamibi. Rest ECG: NSR, Nonspecific ST changes.  Stress Procedure:  The patient  performed treadmill exercise using a Bruce  Protocol for 6:00 minutes. The patient stopped due to fatigue and denied any chest pain.  There were no significant ST-T wave changes.  Technetium 73m Tetrofosmin was injected at peak exercise and myocardial perfusion imaging was performed after a brief delay. Stress ECG: No significant ST segment change suggestive of ischemia.  QPS Raw Data Images:  Acquisition technically good; normal left ventricular size. Stress Images:  Normal homogeneous uptake in all areas of the myocardium. Rest Images:  Normal homogeneous uptake in all areas of the myocardium. Subtraction (SDS):  No evidence of ischemia. Transient Ischemic Dilatation (Normal <1.22):  0.87 Lung/Heart Ratio (Normal <0.45):  0.28  Quantitative Gated Spect Images QGS EDV:  53 ml QGS ESV:  13 ml  Impression Exercise Capacity:  Fair exercise capacity. BP Response:  Normal blood pressure response. Clinical Symptoms:  No chest pain or dyspnea ECG Impression:  No significant ST segment change suggestive of ischemia. Comparison with Prior Nuclear Study: No images to compare  Overall Impression:  Normal stress nuclear study.  LV Ejection Fraction: 75.  LV Wall Motion:  NL LV Function; NL Wall Motion   Olga Millers

## 2012-07-18 ENCOUNTER — Telehealth: Payer: Self-pay | Admitting: *Deleted

## 2012-07-18 NOTE — Telephone Encounter (Signed)
Normal study - no evidence of any coronary blockage. Pt called.

## 2012-07-18 NOTE — Telephone Encounter (Signed)
PATIENT CALLED REQUEST RESULTS OF HER STRESS TEST. CB# [phone]

## 2012-09-21 ENCOUNTER — Encounter: Payer: Self-pay | Admitting: Internal Medicine

## 2012-09-21 ENCOUNTER — Ambulatory Visit (INDEPENDENT_AMBULATORY_CARE_PROVIDER_SITE_OTHER): Payer: Medicare Other | Admitting: Internal Medicine

## 2012-09-21 ENCOUNTER — Other Ambulatory Visit (INDEPENDENT_AMBULATORY_CARE_PROVIDER_SITE_OTHER): Payer: Medicare Other

## 2012-09-21 VITALS — BP 146/78 | HR 83 | Temp 98.4°F | Resp 10 | Ht 62.25 in | Wt 166.0 lb

## 2012-09-21 DIAGNOSIS — Z0000 Encounter for general adult medical examination without abnormal findings: Secondary | ICD-10-CM

## 2012-09-21 DIAGNOSIS — E785 Hyperlipidemia, unspecified: Secondary | ICD-10-CM

## 2012-09-21 DIAGNOSIS — I10 Essential (primary) hypertension: Secondary | ICD-10-CM

## 2012-09-21 LAB — LIPID PANEL
Cholesterol: 229 mg/dL — ABNORMAL HIGH (ref 0–200)
HDL: 68.8 mg/dL (ref >39.00)
Total CHOL/HDL Ratio: 3
Triglycerides: 109 mg/dL (ref 0.0–149.0)
VLDL: 21.8 mg/dL (ref 0.0–40.0)

## 2012-09-21 LAB — COMPREHENSIVE METABOLIC PANEL
ALT: 18 U/L (ref 0–35)
AST: 18 U/L (ref 0–37)
Alkaline Phosphatase: 84 U/L (ref 39–117)
BUN: 13 mg/dL (ref 6–23)
Calcium: 10 mg/dL (ref 8.4–10.5)
Creatinine, Ser: 0.9 mg/dL (ref 0.4–1.2)
Total Bilirubin: 0.8 mg/dL (ref 0.3–1.2)

## 2012-09-21 NOTE — Progress Notes (Signed)
Subjective:    Patient ID: TURQUOISE ESCH, female    DOB: 08-29-1940, 72 y.o.   MRN: [id number]  HPI Sharon Zavala is here for annual Medicare wellness examination and management of other chronic and acute problems. She reports that she has been doing well. She did have stress test in December which was normal.    The risk factors are reflected in the social history.  The roster of all physicians providing medical care to patient - is listed in the Snapshot section of the chart.  Activities of daily living:  The patient is 100% inedpendent in all ADLs: dressing, toileting, feeding as well as independent mobility  Home safety : The patient has smoke detectors in the home. Falls - no fall. HOme is fall safe.  They wear seatbelts. No firearms at home  There is no violence in the home.   There is no risks for hepatitis, STDs or HIV. There is no   history of blood transfusion. They have no travel history to infectious disease endemic areas of the world.  The patient has  seen their dentist in the last six month. They have seen their eye doctor in the last year. They deny any hearing difficulty and have not had audiologic testing in the last year.    They do not  have excessive sun exposure. Discussed the need for sun protection: hats, long sleeves and use of sunscreen if there is significant sun exposure.   Diet: the importance of a healthy diet is discussed. They do have a healthy (unhealthy-high fat/fast food) diet.  The patient has a regular exercise program: walking , 45 min duration, 5 per week.  The benefits of regular aerobic exercise were discussed.  Depression screen: there are no signs or vegative symptoms of depression- irritability, change in appetite, anhedonia, sadness/tearfullness.  Cognitive assessment: the patient manages all their financial and personal affairs and is actively engaged.   The following portions of the patient's history were reviewed and updated as  appropriate: allergies, current medications, past family history, past medical history,  past surgical history, past social history  and problem list.  Past Medical History  Diagnosis Date  . 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 Unspecified essential hypertension   . Routine general medical examination at a health care facility    Past Surgical History  Procedure Laterality Date  . Tonsillectomy    . Mastectomy modified radical  '89 & '97    Bilateral   Family History  Problem Relation Age of Onset  . Prostate cancer Father 36    deceased  . Other Mother 40    natural causes  . Breast cancer Sister   . Colon cancer Neg Hx   . Diabetes Neg Hx   . Coronary artery disease Neg Hx    History   Social History  . Marital Status: Married    Spouse Name: N/A    Number of Children: N/A  . Years of Education: N/A   Occupational History  . Not on file.   Social History Main Topics  . Smoking status: Never Smoker   . Smokeless tobacco: Not on file  . Alcohol Use: Not on file  . Drug Use: Not on file  . Sexually Active: Not on file   Other Topics Concern  . Not on file   Social History Narrative   [employer]   Married '67   1 daughter - '68; 1 grandchild   Lives  with husband - independently ; he has made a good recovery from prostate surgery-'09    Current Outpatient Prescriptions on File Prior to Visit  Medication Sig Dispense Refill  . aspirin 81 MG tablet Take 81 mg by mouth daily.      . Calcium Carbonate-Vit D-Min 1200-1000 MG-UNIT CHEW Chew 1 each by mouth daily.      . Enalapril-Hydrochlorothiazide 5-12.5 MG per tablet Take 1 tablet by mouth  daily  90 tablet  0  . Multiple Vitamin (MULTIVITAMIN) tablet Take 1 tablet by mouth daily.      . potassium chloride (KLOR-CON) 8 MEQ tablet Take 1 tablet by mouth  daily  90 tablet  0   No current facility-administered medications on file prior to visit.    Vision, hearing, body  mass index were assessed and reviewed.   During the course of the visit the patient was educated and counseled about appropriate screening and preventive services including : fall prevention , diabetes screening, nutrition counseling, colorectal cancer screening, and recommended immunizations.    Review of Systems Constitutional:  Negative for fever, chills, activity change and unexpected weight change.  HEENT:  Negative for hearing loss, ear pain, congestion, neck stiffness and postnasal drip. Negative for sore throat or swallowing problems. Negative for dental complaints.   Eyes: Negative for vision loss or change in visual acuity.  Respiratory: Negative for chest tightness and wheezing. Negative for DOE.   Cardiovascular: Negative for chest pain or palpitations. No decreased exercise tolerance Gastrointestinal: No change in bowel habit. No bloating or gas. No reflux or indigestion Genitourinary: Negative for urgency, frequency, flank pain and difficulty urinating.  Musculoskeletal: Negative for myalgias, back pain, arthralgias and gait problem.  Neurological: Negative for dizziness, tremors, weakness and headaches.  Hematological: Negative for adenopathy.  Psychiatric/Behavioral: Negative for behavioral problems and dysphoric mood.       Objective:   Physical Exam Filed Vitals:   09/21/12 1330  BP: 146/78  Pulse: 83  Temp: 98.4 F (36.9 C)  Resp: 10   Wt Readings from Last 3 Encounters:  09/21/12 166 lb (75.297 kg)  07/06/12 161 lb (73.029 kg)  07/05/12 166 lb 1.3 oz (75.333 kg)   Gen'l: well nourished, well developed, nicely groomed whiteWoman in no distress HEENT - [place]/AT, EACs/TMs normal, oropharynx with native dentition in good condition, no buccal or palatal lesions, posterior pharynx clear, mucous membranes moist. C&S clear, PERRLA, fundi - normal Neck - supple, no thyromegaly Nodes- negative submental, cervical, supraclavicular regions Chest - no deformity, no  CVAT Lungs - clear without rales, wheezes. No increased work of breathing Breast - deferred to gyn Cardiovascular - regular rate and rhythm, quiet precordium, no murmurs, rubs or gallops, 2+ radial, DP and PT pulses Abdomen - BS+ x 4, no HSM, no guarding or rebound or tenderness Pelvic - deferred to gyn Rectal - deferred to gyn Extremities - no clubbing, cyanosis, edema or deformity.  Neuro - A&O x 3, CN II-XII normal, motor strength normal and equal, DTRs 2+ and symmetrical biceps, radial, and patellar tendons. Cerebellar - no tremor, no rigidity, fluid movement and normal gait. Derm - Head, neck, back, abdomen and extremities without suspicious lesions  Lab Results  Component Value Date   WBC 4.5 11/27/2007   HGB 14.2 11/27/2007   HCT 41.4 11/27/2007   PLT 339 11/27/2007   GLUCOSE 95 09/21/2012   CHOL 229* 09/21/2012   TRIG 109.0 09/21/2012   HDL 68.80 09/21/2012   LDLDIRECT 120.1 09/21/2012  ALT 18 09/21/2012   AST 18 09/21/2012   NA 140 09/21/2012   K 5.0 09/21/2012   CL 103 09/21/2012   CREATININE 0.9 09/21/2012   BUN 13 09/21/2012   CO2 30 09/21/2012   TSH 1.68 01/27/2009          Assessment & Plan:

## 2012-09-21 NOTE — Patient Instructions (Addendum)
Thanks for coming in.  You are doing great.

## 2012-09-21 NOTE — Assessment & Plan Note (Addendum)
Minimal elevation at last lab. Takes no medication.  Plan  Follow up lab with recommendations to follow  Addendum - normal LDL and HDL

## 2012-09-21 NOTE — Assessment & Plan Note (Signed)
BP Readings from Last 3 Encounters:  09/21/12 146/78  07/06/12 145/78  07/05/12 172/92   Adequate control on present medications  Plan  Routine lab  Continue present medication.

## 2012-09-24 NOTE — Assessment & Plan Note (Signed)
INterval history is unremarkable. Physical exam, sans breast and pelvic, is normal. Labs reviewed - in normal limits. Current with colorectal cancer screening. Immunizations are up to date.  In summary - a very nice woman who appears to be medically stable and doing well. She is encouraged to increase her exercise and to continue her healthy life style. She will return in 1 year or sooner as needed.

## 2012-10-09 ENCOUNTER — Other Ambulatory Visit: Payer: Self-pay | Admitting: *Deleted

## 2012-10-09 MED ORDER — ENALAPRIL-HYDROCHLOROTHIAZIDE 5-12.5 MG PO TABS: 1.0000 | ORAL_TABLET | Freq: Every day | ORAL | Status: DC

## 2012-10-10 ENCOUNTER — Other Ambulatory Visit: Payer: Self-pay

## 2012-10-10 MED ORDER — POTASSIUM CHLORIDE ER 8 MEQ PO TBCR: 8.0000 meq | EXTENDED_RELEASE_TABLET | Freq: Every day | ORAL | Status: DC

## 2012-10-22 ENCOUNTER — Encounter: Payer: Self-pay | Admitting: Internal Medicine

## 2012-12-19 ENCOUNTER — Other Ambulatory Visit: Payer: Self-pay

## 2012-12-19 MED ORDER — POTASSIUM CHLORIDE ER 8 MEQ PO TBCR: 8.0000 meq | EXTENDED_RELEASE_TABLET | Freq: Every day | ORAL | Status: DC

## 2013-09-24 ENCOUNTER — Encounter: Payer: Self-pay | Admitting: Internal Medicine

## 2013-09-24 ENCOUNTER — Other Ambulatory Visit (INDEPENDENT_AMBULATORY_CARE_PROVIDER_SITE_OTHER): Payer: Managed Care, Other (non HMO)

## 2013-09-24 ENCOUNTER — Ambulatory Visit (INDEPENDENT_AMBULATORY_CARE_PROVIDER_SITE_OTHER): Payer: Managed Care, Other (non HMO) | Admitting: Internal Medicine

## 2013-09-24 VITALS — BP 154/78 | HR 65 | Temp 97.0°F | Ht 62.5 in | Wt 169.0 lb

## 2013-09-24 DIAGNOSIS — I10 Essential (primary) hypertension: Secondary | ICD-10-CM

## 2013-09-24 DIAGNOSIS — C50919 Malignant neoplasm of unspecified site of unspecified female breast: Secondary | ICD-10-CM

## 2013-09-24 DIAGNOSIS — Z0000 Encounter for general adult medical examination without abnormal findings: Secondary | ICD-10-CM

## 2013-09-24 DIAGNOSIS — Z23 Encounter for immunization: Secondary | ICD-10-CM

## 2013-09-24 LAB — BASIC METABOLIC PANEL WITH GFR
BUN: 15 mg/dL (ref 6–23)
CO2: 30 meq/L (ref 19–32)
Calcium: 9.6 mg/dL (ref 8.4–10.5)
Chloride: 106 meq/L (ref 96–112)
Creatinine, Ser: 0.8 mg/dL (ref 0.4–1.2)
GFR: 77.32 mL/min
Glucose, Bld: 88 mg/dL (ref 70–99)
Potassium: 5 meq/L (ref 3.5–5.1)
Sodium: 140 meq/L (ref 135–145)

## 2013-09-24 LAB — TSH: TSH: 1.47 u[IU]/mL (ref 0.35–5.50)

## 2013-09-24 MED ORDER — POTASSIUM CHLORIDE ER 8 MEQ PO TBCR: 8.0000 meq | EXTENDED_RELEASE_TABLET | Freq: Every day | ORAL | Status: DC

## 2013-09-24 MED ORDER — ENALAPRIL-HYDROCHLOROTHIAZIDE 5-12.5 MG PO TABS: 1.0000 | ORAL_TABLET | Freq: Every day | ORAL | Status: DC

## 2013-09-24 NOTE — Progress Notes (Signed)
Subjective:    Patient ID: Sharon Zavala, female    DOB: 12/22/1940, 73 y.o.   MRN: [id number]  HPI The patient is here for annual Medicare wellness examination and management of other chronic and acute problems.  Interval h/x since last Feb '14 - has done well w/o major illness, no surgery and no injury.    The risk factors are reflected in the social history.  The roster of all physicians providing medical care to patient - is listed in the Snapshot section of the chart.  Activities of daily living:  The patient is 100% inedpendent in all ADLs: dressing, toileting, feeding as well as independent mobility  Home safety : The patient has smoke detectors in the home. Falls - none and home is fall safe.  They wear seatbelts. No firearms at home. There is no violence in the home.   There is no risks for hepatitis, STDs or HIV. There is no   history of blood transfusion. They have no travel history to infectious disease endemic areas of the world.  The patient has  seen their dentist in the last six month. They have seen their eye doctor in the last year. They deny Sharon hearing difficulty and have not had audiologic testing in the last year.    They do not  have excessive sun exposure. Discussed the need for sun protection: hats, long sleeves and use of sunscreen if there is significant sun exposure.   Diet: the importance of a healthy diet is discussed. They do have a healthy walking  diet.  The patient has a regular exercise program: walking  , 30-45 minutes duration, 4 per week.  The benefits of regular aerobic exercise were discussed.  Depression screen: there are no signs or vegative symptoms of depression- irritability, change in appetite, anhedonia, sadness/tearfullness.  Cognitive assessment: the patient manages all their financial and personal affairs and is actively engaged.   The following portions of the patient's history were reviewed and updated as appropriate:  allergies, current medications, past family history, past medical history,  past surgical history, past social history  and problem list.  Vision, hearing, body mass index were assessed and reviewed.   During the course of the visit the patient was educated and counseled about appropriate screening and preventive services including : fall prevention , diabetes screening, nutrition counseling, colorectal cancer screening, and recommended immunizations.  Past Medical History  Diagnosis Date  . 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 Unspecified essential hypertension   . Routine general medical examination at a health care facility    Past Surgical History  Procedure Laterality Date  . Tonsillectomy    . Mastectomy modified radical  '89 & '97    Bilateral   Family History  Problem Relation Age of Onset  . Prostate cancer Father 68    deceased  . Other Mother 47    natural causes  . Breast cancer Sister   . Colon cancer Neg Hx   . Diabetes Neg Hx   . Coronary artery disease Neg Hx    History   Social History  . Marital Status: Married    Spouse Name: N/A    Number of Children: N/A  . Years of Education: N/A   Occupational History  . Not on file.   Social History Main Topics  . Smoking status: Never Smoker   . Smokeless tobacco: Not on file  . Alcohol Use: Yes     Comment:  occ  . Drug Use: No  . Sexual Activity: Not on file   Other Topics Concern  . Not on file   Social History Narrative   [place]   Married '67   1 daughter - '68; 1 grandchild   Lives with husband - independently ; he has made a good recovery from prostate surgery-'09     Current Outpatient Prescriptions on File Prior to Visit  Medication Sig Dispense Refill  . aspirin 81 MG tablet Take 81 mg by mouth daily.      . Calcium Carbonate-Vit D-Min 1200-1000 MG-UNIT CHEW Chew 1 each by mouth daily.      . Enalapril-Hydrochlorothiazide 5-12.5 MG per tablet  Take 1 tablet by mouth daily.  90 tablet  3  . Multiple Vitamin (MULTIVITAMIN) tablet Take 1 tablet by mouth daily.      . potassium chloride (KLOR-CON) 8 MEQ tablet Take 1 tablet (8 mEq total) by mouth daily.  90 tablet  3   No current facility-administered medications on file prior to visit.      Review of Systems Constitutional:  Negative for fever, chills, activity change and unexpected weight change.  HEENT:  Negative for hearing loss, ear pain, congestion, neck stiffness and postnasal drip. Negative for sore throat or swallowing problems. Negative for dental complaints.   Eyes: Negative for vision loss or change in visual acuity.  Respiratory: Negative for chest tightness and wheezing. Negative for DOE.   Cardiovascular: Negative for chest pain or palpitations. No decreased exercise tolerance Gastrointestinal: No change in bowel habit. No bloating or gas. No reflux or indigestion Genitourinary: Negative for urgency, frequency, flank pain and difficulty urinating.  Musculoskeletal: Negative for myalgias, back pain, arthralgias and gait problem.  Neurological: Negative for dizziness, tremors, weakness and headaches.  Hematological: Negative for adenopathy.  Psychiatric/Behavioral: Negative for behavioral problems and dysphoric mood.       Objective:   Physical Exam Filed Vitals:   09/24/13 1322  BP: 154/78  Pulse: 65  Temp: 97 F (36.1 C)   Wt Readings from Last 3 Encounters:  09/24/13 169 lb (76.658 kg)  09/21/12 166 lb (75.297 kg)  07/06/12 161 lb (73.029 kg)   Gen'l: well nourished, well developed Woman in no distress HEENT - [place]/AT, EACs/TMs normal, oropharynx with native dentition in good condition, no buccal or palatal lesions, posterior pharynx clear, mucous membranes moist. C&S clear, PERRLA, fundi - normal Neck - supple, no thyromegaly Nodes- negative submental, cervical, supraclavicular regions Chest - no deformity, no CVAT Lungs - clear without rales, wheezes.  No increased work of breathing Breast - deferred - s/p bilateral mastectomy Cardiovascular - regular rate and rhythm, quiet precordium, no murmurs, rubs or gallops, 2+ radial, DP and PT pulses Abdomen - BS+ x 4, no HSM, no guarding or rebound or tenderness Pelvic - deferred to gyn Rectal - deferred to gyn Extremities - no clubbing, cyanosis, edema or deformity.  Neuro - A&O x 3, CN II-XII normal, motor strength normal and equal, DTRs 2+ and symmetrical biceps, radial, and patellar tendons. Cerebellar - no tremor, no rigidity, fluid movement and normal gait. Derm - Head, neck, back, abdomen and extremities without suspicious lesions  Recent Results (from the past 2160 hour(s))  BASIC METABOLIC PANEL     Status: None   Collection Time    09/24/13  2:29 PM      Result Value Ref Range   Sodium 140  135 - 145 mEq/L   Potassium 5.0  3.5 -  5.1 mEq/L   Chloride 106  96 - 112 mEq/L   CO2 30  19 - 32 mEq/L   Glucose, Bld 88  70 - 99 mg/dL   BUN 15  6 - 23 mg/dL   Creatinine, Ser 0.8  0.4 - 1.2 mg/dL   Calcium 9.6  8.4 - 10.5 mg/dL   GFR 77.32  >60.00 mL/min  TSH     Status: None   Collection Time    09/24/13  2:29 PM      Result Value Ref Range   TSH 1.47  0.35 - 5.50 uIU/mL         Assessment & Plan:

## 2013-09-24 NOTE — Progress Notes (Signed)
Pre visit review using our clinic review tool, if applicable. No additional management support is needed unless otherwise documented below in the visit note. 

## 2013-09-24 NOTE — Patient Instructions (Signed)
Thanks for coming in to see me.   Your exam is normal. Will get routine lab. You had a normal cholesterol panel in 2014 and this should be repeated in 2017. No need for a blood count.  Lab results will be mailed to you.  Immunizations - up to date. Will give Prevnar vaccine today. Once and done. You are current with Gyn and with colonoscopy.  Thanks for your confidence and trust over the years. You will do really well with Dr. Gwendolyn Grant - she is very nice and very competent.

## 2013-09-25 NOTE — Assessment & Plan Note (Signed)
Stable and doing well. She does not have follow up imaging.

## 2013-09-25 NOTE — Assessment & Plan Note (Signed)
BP Readings from Last 3 Encounters:  09/24/13 154/78  09/21/12 146/78  07/06/12 145/78   Mild elevation of BP today but generally acceptable control.  Plan Continue present medications

## 2013-09-25 NOTE — Assessment & Plan Note (Signed)
Interval history is unremarkable. Limited physical exam is normal except for weight. Labs reviewed - normal. She is current with colorectal cancer screening. Immunizations are up to date.   In summary  A very nice woman who is medically stable at this time.

## 2013-09-28 ENCOUNTER — Encounter: Payer: Self-pay | Admitting: Internal Medicine

## 2014-06-03 ENCOUNTER — Encounter: Payer: Self-pay | Admitting: Internal Medicine

## 2014-09-26 ENCOUNTER — Encounter: Payer: Self-pay | Admitting: Internal Medicine

## 2014-09-26 ENCOUNTER — Other Ambulatory Visit (INDEPENDENT_AMBULATORY_CARE_PROVIDER_SITE_OTHER): Payer: Medicare HMO

## 2014-09-26 ENCOUNTER — Ambulatory Visit (INDEPENDENT_AMBULATORY_CARE_PROVIDER_SITE_OTHER): Payer: Managed Care, Other (non HMO) | Admitting: Internal Medicine

## 2014-09-26 VITALS — BP 142/86 | HR 93 | Temp 98.3°F | Resp 16 | Ht 63.0 in | Wt 171.4 lb

## 2014-09-26 DIAGNOSIS — E785 Hyperlipidemia, unspecified: Secondary | ICD-10-CM | POA: Diagnosis not present

## 2014-09-26 DIAGNOSIS — Z0000 Encounter for general adult medical examination without abnormal findings: Secondary | ICD-10-CM

## 2014-09-26 DIAGNOSIS — I10 Essential (primary) hypertension: Secondary | ICD-10-CM

## 2014-09-26 LAB — COMPREHENSIVE METABOLIC PANEL
ALT: 14 U/L (ref 0–35)
AST: 13 U/L (ref 0–37)
Albumin: 4.2 g/dL (ref 3.5–5.2)
Alkaline Phosphatase: 99 U/L (ref 39–117)
BUN: 20 mg/dL (ref 6–23)
CALCIUM: 9.9 mg/dL (ref 8.4–10.5)
CHLORIDE: 102 meq/L (ref 96–112)
CO2: 30 mEq/L (ref 19–32)
Creatinine, Ser: 0.92 mg/dL (ref 0.40–1.20)
GFR: 63.73 mL/min (ref >60.00)
Glucose, Bld: 99 mg/dL (ref 70–99)
POTASSIUM: 4.2 meq/L (ref 3.5–5.1)
Sodium: 140 mEq/L (ref 135–145)
Total Bilirubin: 0.7 mg/dL (ref 0.2–1.2)
Total Protein: 7.2 g/dL (ref 6.0–8.3)

## 2014-09-26 LAB — LIPID PANEL
CHOL/HDL RATIO: 3
CHOLESTEROL: 208 mg/dL — AB (ref 0–200)
HDL: 69.2 mg/dL (ref >39.00)
LDL Cholesterol: 117 mg/dL — ABNORMAL HIGH (ref 0–99)
NonHDL: 138.8
TRIGLYCERIDES: 109 mg/dL (ref 0.0–149.0)
VLDL: 21.8 mg/dL (ref 0.0–40.0)

## 2014-09-26 MED ORDER — ENALAPRIL-HYDROCHLOROTHIAZIDE 5-12.5 MG PO TABS: 1.0000 | ORAL_TABLET | Freq: Every day | ORAL | Status: DC

## 2014-09-26 MED ORDER — POTASSIUM CHLORIDE ER 8 MEQ PO TBCR: 8.0000 meq | EXTENDED_RELEASE_TABLET | Freq: Every day | ORAL | Status: DC

## 2014-09-26 NOTE — Progress Notes (Signed)
Pre visit review using our clinic review tool, if applicable. No additional management support is needed unless otherwise documented below in the visit note. 

## 2014-09-26 NOTE — Progress Notes (Signed)
   Subjective:    Patient ID: Sharon Zavala, female    DOB: 1941-02-17, 74 y.o.   MRN: [id number]  HPI She is a 74 YO female coming here for medicare wellness. Denies any new problems since last year. See A/P for details about chronic conditions treatment and status.   Diet: heart healthy Physical activity: sedentary Depression/mood screen: negative Hearing: intact to whispered voice Visual acuity: grossly normal, performs annual eye exam  ADLs: capable Fall risk: none Home safety: good Cognitive evaluation: intact to orientation, naming, recall and repetition EOL planning: advanced care planning discussed, she has discussed with family but nothing written down  I have personally reviewed and have noted 1. The patient's medical and social history 2. Their use of alcohol, tobacco or illicit drugs 3. Their current medications and supplements 4. The patient's functional ability including ADL's, fall risks, home safety risks and hearing or visual impairment. 5. Diet and physical activities 6. Evidence for depression or mood disorders 7. Care team reviewed and updated  Review of Systems  Constitutional: Negative for fever, activity change, appetite change, fatigue and unexpected weight change.  HENT: Negative.   Eyes: Negative.   Respiratory: Negative for cough, chest tightness, shortness of breath and wheezing.   Cardiovascular: Negative for chest pain, palpitations and leg swelling.  Gastrointestinal: Negative for abdominal pain, diarrhea, constipation and abdominal distention.  Musculoskeletal: Negative.   Skin: Negative.   Neurological: Negative.   Psychiatric/Behavioral: Negative.       Objective:   Physical Exam  Constitutional: She is oriented to person, place, and time. She appears well-developed and well-nourished.  HENT:  Head: Normocephalic and atraumatic.  Eyes: EOM are normal.  Neck: Normal range of motion.  Cardiovascular: Normal rate and regular rhythm.     Carotids without bruits.  Pulmonary/Chest: Effort normal and breath sounds normal.  Abdominal: Soft. Bowel sounds are normal. She exhibits no distension. There is no tenderness. There is no rebound.  Musculoskeletal: She exhibits no edema.  Neurological: She is alert and oriented to person, place, and time. Coordination normal.  Skin: Skin is warm and dry.  Psychiatric: She has a normal mood and affect.   Filed Vitals:   09/26/14 0901  BP: 142/86  Pulse: 93  Temp: 98.3 F (36.8 C)  TempSrc: Oral  Resp: 16  Height: 5\' 3"  (1.6 m)  Weight: 171 lb 6.4 oz (77.747 kg)  SpO2: 99%      Assessment & Plan:

## 2014-09-26 NOTE — Patient Instructions (Signed)
We have given you the refills. If you need them sent to a different place (i.e. If you fill them in [facility] then want to fill it here in [place]) please call our office.   We will check on your blood work and call you back about the results.   You are doing well and will see you back next year. If you have any questions or problems before then please feel free to call the office.   Health Maintenance Adopting a healthy lifestyle and getting preventive care can go a long way to promote health and wellness. Talk with your health care provider about what schedule of regular examinations is right for you. This is a good chance for you to check in with your provider about disease prevention and staying healthy. In between checkups, there are plenty of things you can do on your own. Experts have done a lot of research about which lifestyle changes and preventive measures are most likely to keep you healthy. Ask your health care provider for more information. WEIGHT AND DIET  Eat a healthy diet  Be sure to include plenty of vegetables, fruits, low-fat dairy products, and lean protein.  Do not eat a lot of foods high in solid fats, added sugars, or salt.  Get regular exercise. This is one of the most important things you can do for your health.  Most adults should exercise for at least 150 minutes each week. The exercise should increase your heart rate and make you sweat (moderate-intensity exercise).  Most adults should also do strengthening exercises at least twice a week. This is in addition to the moderate-intensity exercise.  Maintain a healthy weight  Body mass index (BMI) is a measurement that can be used to identify possible weight problems. It estimates body fat based on height and weight. Your health care provider can help determine your BMI and help you achieve or maintain a healthy weight.  For females 41 years of age and older:   A BMI below 18.5 is considered  underweight.  A BMI of 18.5 to 24.9 is normal.  A BMI of 25 to 29.9 is considered overweight.  A BMI of 30 and above is considered obese.  Watch levels of cholesterol and blood lipids  You should start having your blood tested for lipids and cholesterol at 73 years of age, then have this test every 5 years.  You may need to have your cholesterol levels checked more often if:  Your lipid or cholesterol levels are high.  You are older than 73 years of age.  You are at high risk for heart disease.  CANCER SCREENING   Lung Cancer  Lung cancer screening is recommended for adults 15-70 years old who are at high risk for lung cancer because of a history of smoking.  A yearly low-dose CT scan of the lungs is recommended for people who:  Currently smoke.  Have quit within the past 15 years.  Have at least a 30-pack-year history of smoking. A pack year is smoking an average of one pack of cigarettes a day for 1 year.  Yearly screening should continue until it has been 15 years since you quit.  Yearly screening should stop if you develop a health problem that would prevent you from having lung cancer treatment.  Breast Cancer  Practice breast self-awareness. This means understanding how your breasts normally appear and feel.  It also means doing regular breast self-exams. Let your health care provider know about  any changes, no matter how small.  If you are in your 20s or 30s, you should have a clinical breast exam (CBE) by a health care provider every 1-3 years as part of a regular health exam.  If you are 65 or older, have a CBE every year. Also consider having a breast X-ray (mammogram) every year.  If you have a family history of breast cancer, talk to your health care provider about genetic screening.  If you are at high risk for breast cancer, talk to your health care provider about having an MRI and a mammogram every year.  Breast cancer gene (BRCA) assessment is  recommended for women who have family members with BRCA-related cancers. BRCA-related cancers include:  Breast.  Ovarian.  Tubal.  Peritoneal cancers.  Results of the assessment will determine the need for genetic counseling and BRCA1 and BRCA2 testing. Cervical Cancer Routine pelvic examinations to screen for cervical cancer are no longer recommended for nonpregnant women who are considered low risk for cancer of the pelvic organs (ovaries, uterus, and vagina) and who do not have symptoms. A pelvic examination may be necessary if you have symptoms including those associated with pelvic infections. Ask your health care provider if a screening pelvic exam is right for you.   The Pap test is the screening test for cervical cancer for women who are considered at risk.  If you had a hysterectomy for a problem that was not cancer or a condition that could lead to cancer, then you no longer need Pap tests.  If you are older than 65 years, and you have had normal Pap tests for the past 10 years, you no longer need to have Pap tests.  If you have had past treatment for cervical cancer or a condition that could lead to cancer, you need Pap tests and screening for cancer for at least 20 years after your treatment.  If you no longer get a Pap test, assess your risk factors if they change (such as having a new sexual partner). This can affect whether you should start being screened again.  Some women have medical problems that increase their chance of getting cervical cancer. If this is the case for you, your health care provider may recommend more frequent screening and Pap tests.  The human papillomavirus (HPV) test is another test that may be used for cervical cancer screening. The HPV test looks for the virus that can cause cell changes in the cervix. The cells collected during the Pap test can be tested for HPV.  The HPV test can be used to screen women 41 years of age and older. Getting tested  for HPV can extend the interval between normal Pap tests from three to five years.  An HPV test also should be used to screen women of any age who have unclear Pap test results.  After 73 years of age, women should have HPV testing as often as Pap tests.  Colorectal Cancer  This type of cancer can be detected and often prevented.  Routine colorectal cancer screening usually begins at 73 years of age and continues through 73 years of age.  Your health care provider may recommend screening at an earlier age if you have risk factors for colon cancer.  Your health care provider may also recommend using home test kits to check for hidden blood in the stool.  A small camera at the end of a tube can be used to examine your colon directly (sigmoidoscopy or  colonoscopy). This is done to check for the earliest forms of colorectal cancer.  Routine screening usually begins at age 32.  Direct examination of the colon should be repeated every 5-10 years through 73 years of age. However, you may need to be screened more often if early forms of precancerous polyps or small growths are found. Skin Cancer  Check your skin from head to toe regularly.  Tell your health care provider about any new moles or changes in moles, especially if there is a change in a mole's shape or color.  Also tell your health care provider if you have a mole that is larger than the size of a pencil eraser.  Always use sunscreen. Apply sunscreen liberally and repeatedly throughout the day.  Protect yourself by wearing long sleeves, pants, a wide-brimmed hat, and sunglasses whenever you are outside. HEART DISEASE, DIABETES, AND HIGH BLOOD PRESSURE   Have your blood pressure checked at least every 1-2 years. High blood pressure causes heart disease and increases the risk of stroke.  If you are between 71 years and 74 years old, ask your health care provider if you should take aspirin to prevent strokes.  Have regular  diabetes screenings. This involves taking a blood sample to check your fasting blood sugar level.  If you are at a normal weight and have a low risk for diabetes, have this test once every three years after 73 years of age.  If you are overweight and have a high risk for diabetes, consider being tested at a younger age or more often. PREVENTING INFECTION  Hepatitis B  If you have a higher risk for hepatitis B, you should be screened for this virus. You are considered at high risk for hepatitis B if:  You were born in a country where hepatitis B is common. Ask your health care provider which countries are considered high risk.  Your parents were born in a high-risk country, and you have not been immunized against hepatitis B (hepatitis B vaccine).  You have HIV or AIDS.  You use needles to inject street drugs.  You live with someone who has hepatitis B.  You have had sex with someone who has hepatitis B.  You get hemodialysis treatment.  You take certain medicines for conditions, including cancer, organ transplantation, and autoimmune conditions. Hepatitis C  Blood testing is recommended for:  Everyone born from [birth year] through 1964.  Anyone with known risk factors for hepatitis C. Sexually transmitted infections (STIs)  You should be screened for sexually transmitted infections (STIs) including gonorrhea and chlamydia if:  You are sexually active and are younger than 73 years of age.  You are older than 73 years of age and your health care provider tells you that you are at risk for this type of infection.  Your sexual activity has changed since you were last screened and you are at an increased risk for chlamydia or gonorrhea. Ask your health care provider if you are at risk.  If you do not have HIV, but are at risk, it may be recommended that you take a prescription medicine daily to prevent HIV infection. This is called pre-exposure prophylaxis (PrEP). You are considered at  risk if:  You are sexually active and do not regularly use condoms or know the HIV status of your partner(s).  You take drugs by injection.  You are sexually active with a partner who has HIV. Talk with your health care provider about whether you are at high risk  of being infected with HIV. If you choose to begin PrEP, you should first be tested for HIV. You should then be tested every 3 months for as long as you are taking PrEP.  PREGNANCY   If you are premenopausal and you may become pregnant, ask your health care provider about preconception counseling.  If you may become pregnant, take 400 to 800 micrograms (mcg) of folic acid every day.  If you want to prevent pregnancy, talk to your health care provider about birth control (contraception). OSTEOPOROSIS AND MENOPAUSE   Osteoporosis is a disease in which the bones lose minerals and strength with aging. This can result in serious bone fractures. Your risk for osteoporosis can be identified using a bone density scan.  If you are 50 years of age or older, or if you are at risk for osteoporosis and fractures, ask your health care provider if you should be screened.  Ask your health care provider whether you should take a calcium or vitamin D supplement to lower your risk for osteoporosis.  Menopause may have certain physical symptoms and risks.  Hormone replacement therapy may reduce some of these symptoms and risks. Talk to your health care provider about whether hormone replacement therapy is right for you.  HOME CARE INSTRUCTIONS   Schedule regular health, dental, and eye exams.  Stay current with your immunizations.   Do not use any tobacco products including cigarettes, chewing tobacco, or electronic cigarettes.  If you are pregnant, do not drink alcohol.  If you are breastfeeding, limit how much and how often you drink alcohol.  Limit alcohol intake to no more than 1 drink per day for nonpregnant women. One drink equals  12 ounces of beer, 5 ounces of wine, or 1 ounces of hard liquor.  Do not use street drugs.  Do not share needles.  Ask your health care provider for help if you need support or information about quitting drugs.  Tell your health care provider if you often feel depressed.  Tell your health care provider if you have ever been abused or do not feel safe at home. Document Released: 02/01/2011 Document Revised: 12/03/2013 Document Reviewed: 06/20/2013 [facility] Patient Information 2015 [place], [place]. This information is not intended to replace advice given to you by your health care provider. Make sure you discuss any questions you have with your health care provider.

## 2014-09-28 ENCOUNTER — Encounter: Payer: Self-pay | Admitting: Internal Medicine

## 2014-09-28 NOTE — Assessment & Plan Note (Signed)
Check lipid panel, not on medication now. Eats healthy diet. Add therapy if needed. Goal <130 LDL.

## 2014-09-28 NOTE — Assessment & Plan Note (Signed)
BP well controlled on enalaril/hctz and room to increase in the future if needed. Check BMP, adjust as needed.

## 2014-09-28 NOTE — Assessment & Plan Note (Signed)
Mammogram last done before bilateral mastectomy and no indication to do. Shingles completed. Flu already gotten this year. Tetanus due in 2022. Completed both pneumonia 23 and 13. Aged out of pap smears and no problems in the past. Colonoscopy due in 2019 and will likely be last screening. Non-smoker and exercises regularly. Talked with her about skin protection and safety.

## 2015-01-23 ENCOUNTER — Encounter: Payer: Self-pay | Admitting: Gastroenterology

## 2015-03-10 ENCOUNTER — Encounter: Payer: Self-pay | Admitting: *Deleted

## 2015-03-13 ENCOUNTER — Telehealth: Payer: Self-pay

## 2015-03-13 NOTE — Telephone Encounter (Signed)
Mammogram modifier changed to indicated that Mammogram is no longer needed.   Patient has a surgical history of mastectomy bilateral.

## 2015-05-20 DIAGNOSIS — Z23 Encounter for immunization: Secondary | ICD-10-CM | POA: Diagnosis not present

## 2015-10-02 ENCOUNTER — Encounter: Payer: Self-pay | Admitting: Internal Medicine

## 2015-10-02 ENCOUNTER — Ambulatory Visit (INDEPENDENT_AMBULATORY_CARE_PROVIDER_SITE_OTHER): Payer: Medicare HMO | Admitting: Internal Medicine

## 2015-10-02 ENCOUNTER — Other Ambulatory Visit (INDEPENDENT_AMBULATORY_CARE_PROVIDER_SITE_OTHER): Payer: Medicare HMO

## 2015-10-02 VITALS — BP 150/82 | HR 78 | Temp 98.7°F | Resp 12 | Ht 63.0 in | Wt 161.8 lb

## 2015-10-02 DIAGNOSIS — E785 Hyperlipidemia, unspecified: Secondary | ICD-10-CM

## 2015-10-02 DIAGNOSIS — Z0000 Encounter for general adult medical examination without abnormal findings: Secondary | ICD-10-CM

## 2015-10-02 DIAGNOSIS — I10 Essential (primary) hypertension: Secondary | ICD-10-CM

## 2015-10-02 LAB — COMPREHENSIVE METABOLIC PANEL
ALBUMIN: 4.1 g/dL (ref 3.5–5.2)
ALK PHOS: 89 U/L (ref 39–117)
ALT: 16 U/L (ref 0–35)
AST: 15 U/L (ref 0–37)
BUN: 14 mg/dL (ref 6–23)
CALCIUM: 9.7 mg/dL (ref 8.4–10.5)
CO2: 32 mEq/L (ref 19–32)
CREATININE: 0.83 mg/dL (ref 0.40–1.20)
Chloride: 102 mEq/L (ref 96–112)
GFR: 71.56 mL/min (ref >60.00)
Glucose, Bld: 93 mg/dL (ref 70–99)
POTASSIUM: 4.3 meq/L (ref 3.5–5.1)
SODIUM: 140 meq/L (ref 135–145)
TOTAL PROTEIN: 6.8 g/dL (ref 6.0–8.3)
Total Bilirubin: 0.6 mg/dL (ref 0.2–1.2)

## 2015-10-02 LAB — CBC
HEMATOCRIT: 44.3 % (ref 36.0–46.0)
HEMOGLOBIN: 15.2 g/dL — AB (ref 12.0–15.0)
MCHC: 34.3 g/dL (ref 30.0–36.0)
MCV: 91.4 fl (ref 78.0–100.0)
PLATELETS: 351 10*3/uL (ref 150.0–400.0)
RBC: 4.85 Mil/uL (ref 3.87–5.11)
RDW: 13.6 % (ref 11.5–15.5)
WBC: 6.6 10*3/uL (ref 4.0–10.5)

## 2015-10-02 LAB — LIPID PANEL
CHOLESTEROL: 214 mg/dL — AB (ref 0–200)
HDL: 70.3 mg/dL (ref >39.00)
LDL Cholesterol: 120 mg/dL — ABNORMAL HIGH (ref 0–99)
NonHDL: 144.09
TRIGLYCERIDES: 119 mg/dL (ref 0.0–149.0)
Total CHOL/HDL Ratio: 3
VLDL: 23.8 mg/dL (ref 0.0–40.0)

## 2015-10-02 MED ORDER — ENALAPRIL-HYDROCHLOROTHIAZIDE 5-12.5 MG PO TABS: 1.0000 | ORAL_TABLET | Freq: Every day | ORAL | Status: DC

## 2015-10-02 MED ORDER — ACYCLOVIR 400 MG PO TABS: 400.0000 mg | ORAL_TABLET | Freq: Three times a day (TID) | ORAL | Status: DC

## 2015-10-02 MED ORDER — POTASSIUM CHLORIDE ER 8 MEQ PO TBCR: 8.0000 meq | EXTENDED_RELEASE_TABLET | Freq: Every day | ORAL | Status: DC

## 2015-10-02 NOTE — Assessment & Plan Note (Signed)
BP at goal on enalapril/hctz and refilled today. Checking CMP and adjust as needed.

## 2015-10-02 NOTE — Patient Instructions (Signed)
We will check the labs today and call you back with the results.  Health Maintenance, Female Adopting a healthy lifestyle and getting preventive care can go a long way to promote health and wellness. Talk with your health care provider about what schedule of regular examinations is right for you. This is a good chance for you to check in with your provider about disease prevention and staying healthy. In between checkups, there are plenty of things you can do on your own. Experts have done a lot of research about which lifestyle changes and preventive measures are most likely to keep you healthy. Ask your health care provider for more information. WEIGHT AND DIET  Eat a healthy diet  Be sure to include plenty of vegetables, fruits, low-fat dairy products, and lean protein.  Do not eat a lot of foods high in solid fats, added sugars, or salt.  Get regular exercise. This is one of the most important things you can do for your health.  Most adults should exercise for at least 150 minutes each week. The exercise should increase your heart rate and make you sweat (moderate-intensity exercise).  Most adults should also do strengthening exercises at least twice a week. This is in addition to the moderate-intensity exercise.  Maintain a healthy weight  Body mass index (BMI) is a measurement that can be used to identify possible weight problems. It estimates body fat based on height and weight. Your health care provider can help determine your BMI and help you achieve or maintain a healthy weight.  For females 20 years of age and older:   A BMI below 18.5 is considered underweight.  A BMI of 18.5 to 24.9 is normal.  A BMI of 25 to 29.9 is considered overweight.  A BMI of 30 and above is considered obese.  Watch levels of cholesterol and blood lipids  You should start having your blood tested for lipids and cholesterol at 74 years of age, then have this test every 5 years.  You may need to  have your cholesterol levels checked more often if:  Your lipid or cholesterol levels are high.  You are older than 74 years of age.  You are at high risk for heart disease.  CANCER SCREENING   Lung Cancer  Lung cancer screening is recommended for adults 55-80 years old who are at high risk for lung cancer because of a history of smoking.  A yearly low-dose CT scan of the lungs is recommended for people who:  Currently smoke.  Have quit within the past 15 years.  Have at least a 30-pack-year history of smoking. A pack year is smoking an average of one pack of cigarettes a day for 1 year.  Yearly screening should continue until it has been 15 years since you quit.  Yearly screening should stop if you develop a health problem that would prevent you from having lung cancer treatment.  Breast Cancer  Practice breast self-awareness. This means understanding how your breasts normally appear and feel.  It also means doing regular breast self-exams. Let your health care provider know about any changes, no matter how small.  If you are in your 20s or 30s, you should have a clinical breast exam (CBE) by a health care provider every 1-3 years as part of a regular health exam.  If you are 40 or older, have a CBE every year. Also consider having a breast X-ray (mammogram) every year.  If you have a family history of   of breast cancer, talk to your health care provider about genetic screening.  If you are at high risk for breast cancer, talk to your health care provider about having an MRI and a mammogram every year.  Breast cancer gene (BRCA) assessment is recommended for women who have family members with BRCA-related cancers. BRCA-related cancers include:  Breast.  Ovarian.  Tubal.  Peritoneal cancers.  Results of the assessment will determine the need for genetic counseling and BRCA1 and BRCA2 testing. Cervical Cancer Your health care provider may recommend that you be  screened regularly for cancer of the pelvic organs (ovaries, uterus, and vagina). This screening involves a pelvic examination, including checking for microscopic changes to the surface of your cervix (Pap test). You may be encouraged to have this screening done every 3 years, beginning at age 39.  For women ages 39-65, health care providers may recommend pelvic exams and Pap testing every 3 years, or they may recommend the Pap and pelvic exam, combined with testing for human papilloma virus (HPV), every 5 years. Some types of HPV increase your risk of cervical cancer. Testing for HPV may also be done on women of any age with unclear Pap test results.  Other health care providers may not recommend any screening for nonpregnant women who are considered low risk for pelvic cancer and who do not have symptoms. Ask your health care provider if a screening pelvic exam is right for you.  If you have had past treatment for cervical cancer or a condition that could lead to cancer, you need Pap tests and screening for cancer for at least 20 years after your treatment. If Pap tests have been discontinued, your risk factors (such as having a new sexual partner) need to be reassessed to determine if screening should resume. Some women have medical problems that increase the chance of getting cervical cancer. In these cases, your health care provider may recommend more frequent screening and Pap tests. Colorectal Cancer  This type of cancer can be detected and often prevented.  Routine colorectal cancer screening usually begins at 74 years of age and continues through 74 years of age.  Your health care provider may recommend screening at an earlier age if you have risk factors for colon cancer.  Your health care provider may also recommend using home test kits to check for hidden blood in the stool.  A small camera at the end of a tube can be used to examine your colon directly (sigmoidoscopy or colonoscopy).  This is done to check for the earliest forms of colorectal cancer.  Routine screening usually begins at age 70.  Direct examination of the colon should be repeated every 5-10 years through 74 years of age. However, you may need to be screened more often if early forms of precancerous polyps or small growths are found. Skin Cancer  Check your skin from head to toe regularly.  Tell your health care provider about any new moles or changes in moles, especially if there is a change in a mole's shape or color.  Also tell your health care provider if you have a mole that is larger than the size of a pencil eraser.  Always use sunscreen. Apply sunscreen liberally and repeatedly throughout the day.  Protect yourself by wearing long sleeves, pants, a wide-brimmed hat, and sunglasses whenever you are outside. HEART DISEASE, DIABETES, AND HIGH BLOOD PRESSURE   High blood pressure causes heart disease and increases the risk of stroke. High blood pressure is  more likely to develop in:  People who have blood pressure in the high end of the normal range (130-139/85-89 mm Hg).  People who are overweight or obese.  People who are African American.  If you are 94-15 years of age, have your blood pressure checked every 3-5 years. If you are 81 years of age or older, have your blood pressure checked every year. You should have your blood pressure measured twice--once when you are at a hospital or clinic, and once when you are not at a hospital or clinic. Record the average of the two measurements. To check your blood pressure when you are not at a hospital or clinic, you can use:  An automated blood pressure machine at a pharmacy.  A home blood pressure monitor.  If you are between 34 years and 64 years old, ask your health care provider if you should take aspirin to prevent strokes.  Have regular diabetes screenings. This involves taking a blood sample to check your fasting blood sugar level.  If you  are at a normal weight and have a low risk for diabetes, have this test once every three years after 74 years of age.  If you are overweight and have a high risk for diabetes, consider being tested at a younger age or more often. PREVENTING INFECTION  Hepatitis B  If you have a higher risk for hepatitis B, you should be screened for this virus. You are considered at high risk for hepatitis B if:  You were born in a country where hepatitis B is common. Ask your health care provider which countries are considered high risk.  Your parents were born in a high-risk country, and you have not been immunized against hepatitis B (hepatitis B vaccine).  You have HIV or AIDS.  You use needles to inject street drugs.  You live with someone who has hepatitis B.  You have had sex with someone who has hepatitis B.  You get hemodialysis treatment.  You take certain medicines for conditions, including cancer, organ transplantation, and autoimmune conditions. Hepatitis C  Blood testing is recommended for:  Everyone born from [birth year] through 1964.  Anyone with known risk factors for hepatitis C. Sexually transmitted infections (STIs)  You should be screened for sexually transmitted infections (STIs) including gonorrhea and chlamydia if:  You are sexually active and are younger than 74 years of age.  You are older than 74 years of age and your health care provider tells you that you are at risk for this type of infection.  Your sexual activity has changed since you were last screened and you are at an increased risk for chlamydia or gonorrhea. Ask your health care provider if you are at risk.  If you do not have HIV, but are at risk, it may be recommended that you take a prescription medicine daily to prevent HIV infection. This is called pre-exposure prophylaxis (PrEP). You are considered at risk if:  You are sexually active and do not regularly use condoms or know the HIV status of your  partner(s).  You take drugs by injection.  You are sexually active with a partner who has HIV. Talk with your health care provider about whether you are at high risk of being infected with HIV. If you choose to begin PrEP, you should first be tested for HIV. You should then be tested every 3 months for as long as you are taking PrEP.  PREGNANCY   If you are premenopausal and you  may become pregnant, ask your health care provider about preconception counseling.  If you may become pregnant, take 400 to 800 micrograms (mcg) of folic acid every day.  If you want to prevent pregnancy, talk to your health care provider about birth control (contraception). OSTEOPOROSIS AND MENOPAUSE   Osteoporosis is a disease in which the bones lose minerals and strength with aging. This can result in serious bone fractures. Your risk for osteoporosis can be identified using a bone density scan.  If you are 43 years of age or older, or if you are at risk for osteoporosis and fractures, ask your health care provider if you should be screened.  Ask your health care provider whether you should take a calcium or vitamin D supplement to lower your risk for osteoporosis.  Menopause may have certain physical symptoms and risks.  Hormone replacement therapy may reduce some of these symptoms and risks. Talk to your health care provider about whether hormone replacement therapy is right for you.  HOME CARE INSTRUCTIONS   Schedule regular health, dental, and eye exams.  Stay current with your immunizations.   Do not use any tobacco products including cigarettes, chewing tobacco, or electronic cigarettes.  If you are pregnant, do not drink alcohol.  If you are breastfeeding, limit how much and how often you drink alcohol.  Limit alcohol intake to no more than 1 drink per day for nonpregnant women. One drink equals 12 ounces of beer, 5 ounces of wine, or 1 ounces of hard liquor.  Do not use street drugs.  Do  not share needles.  Ask your health care provider for help if you need support or information about quitting drugs.  Tell your health care provider if you often feel depressed.  Tell your health care provider if you have ever been abused or do not feel safe at home.   This information is not intended to replace advice given to you by your health care provider. Make sure you discuss any questions you have with your health care provider.   Document Released: 02/01/2011 Document Revised: 08/09/2014 Document Reviewed: 06/20/2013 Elsevier Interactive Patient Education [employer].

## 2015-10-02 NOTE — Assessment & Plan Note (Signed)
Checking lipid panel, not on meds at this time.

## 2015-10-02 NOTE — Progress Notes (Signed)
   Subjective:    Patient ID: Sharon Zavala, female    DOB: May 13, 1941, 75 y.o.   MRN: [id number]  HPI Here for medicare wellness, no new complaints. Please see A/P for status and treatment of chronic medical problems.   Diet: heart healthy Physical activity: sedentary Depression/mood screen: negative Hearing: intact to whispered voice Visual acuity: grossly normal, performs annual eye exam  ADLs: capable Fall risk: none Home safety: good Cognitive evaluation: intact to orientation, naming, recall and repetition EOL planning: adv directives discussed  I have personally reviewed and have noted 1. The patient's medical and social history - reviewed today no changes 2. Their use of alcohol, tobacco or illicit drugs 3. Their current medications and supplements 4. The patient's functional ability including ADL's, fall risks, home safety risks and hearing or visual impairment. 5. Diet and physical activities 6. Evidence for depression or mood disorders 7. Care team reviewed and updated (available in snapshot)  Review of Systems  Constitutional: Negative for fever, activity change, appetite change, fatigue and unexpected weight change.  HENT: Negative.   Eyes: Negative.   Respiratory: Negative for cough, chest tightness, shortness of breath and wheezing.   Cardiovascular: Negative for chest pain, palpitations and leg swelling.  Gastrointestinal: Negative for abdominal pain, diarrhea, constipation and abdominal distention.  Musculoskeletal: Negative.   Skin: Negative.   Neurological: Negative.   Psychiatric/Behavioral: Negative.       Objective:   Physical Exam  Constitutional: She is oriented to person, place, and time. She appears well-developed and well-nourished.  HENT:  Head: Normocephalic and atraumatic.  Eyes: EOM are normal.  Neck: Normal range of motion.  Cardiovascular: Normal rate and regular rhythm.   Carotids without bruits.  Pulmonary/Chest: Effort normal  and breath sounds normal.  Abdominal: Soft. Bowel sounds are normal. She exhibits no distension. There is no tenderness. There is no rebound.  Musculoskeletal: She exhibits no edema.  Neurological: She is alert and oriented to person, place, and time. Coordination normal.  Skin: Skin is warm and dry.  Psychiatric: She has a normal mood and affect.   Filed Vitals:   10/02/15 1324  BP: 150/82  Pulse: 78  Temp: 98.7 F (37.1 C)  TempSrc: Oral  Resp: 12  Height: 5\' 3"  (1.6 m)  Weight: 161 lb 12.8 oz (73.392 kg)  SpO2: 98%      Assessment & Plan:

## 2015-10-02 NOTE — Progress Notes (Signed)
Pre visit review using our clinic review tool, if applicable. No additional management support is needed unless otherwise documented below in the visit note. 

## 2015-10-02 NOTE — Assessment & Plan Note (Signed)
Immunizations up to date including flu shot this year. Colonoscopy due in 2019, mammogram not indicated due to bilateral mastectomy. Given 10 year screening recommendations and counseled on the need for regular exercise.

## 2016-04-22 DIAGNOSIS — Z23 Encounter for immunization: Secondary | ICD-10-CM | POA: Diagnosis not present

## 2016-08-06 ENCOUNTER — Encounter: Payer: Self-pay | Admitting: Internal Medicine

## 2016-08-06 ENCOUNTER — Ambulatory Visit (INDEPENDENT_AMBULATORY_CARE_PROVIDER_SITE_OTHER): Payer: Medicare HMO | Admitting: Internal Medicine

## 2016-08-06 DIAGNOSIS — B9789 Other viral agents as the cause of diseases classified elsewhere: Secondary | ICD-10-CM | POA: Diagnosis not present

## 2016-08-06 DIAGNOSIS — J069 Acute upper respiratory infection, unspecified: Secondary | ICD-10-CM | POA: Insufficient documentation

## 2016-08-06 MED ORDER — LISINOPRIL-HYDROCHLOROTHIAZIDE 10-12.5 MG PO TABS: 1.0000 | ORAL_TABLET | Freq: Every day | ORAL | 3 refills | Status: DC

## 2016-08-06 MED ORDER — HYDROCODONE-HOMATROPINE 5-1.5 MG/5ML PO SYRP: 5.0000 mL | ORAL_SOLUTION | Freq: Three times a day (TID) | ORAL | 0 refills | Status: DC | PRN

## 2016-08-06 NOTE — Assessment & Plan Note (Signed)
Rx for hycodan today for cough so she can sleep. No indication for antibiotics or steroids at today's visit. Overall symptoms improving and discussed expected course with her.

## 2016-08-06 NOTE — Patient Instructions (Signed)
We have given you the cough syrup today to use for the cough.  You can start taking zyrtec to help with the drainage.   It is okay to take cold medicine over the counter if it helps.   Drink plenty of fluids and get rest to help yourself heal faster.   If by Wednesday next week you are not getting better give [place] a call back.   Upper Respiratory Infection, Adult Most upper respiratory infections (URIs) are a viral infection of the air passages leading to the lungs. A URI affects the nose, throat, and upper air passages. The most common type of URI is nasopharyngitis and is typically referred to as "the common cold." URIs run their course and usually go away on their own. Most of the time, a URI does not require medical attention, but sometimes a bacterial infection in the upper airways can follow a viral infection. This is called a secondary infection. Sinus and middle ear infections are common types of secondary upper respiratory infections. Bacterial pneumonia can also complicate a URI. A URI can worsen asthma and chronic obstructive pulmonary disease (COPD). Sometimes, these complications can require emergency medical care and may be life threatening. What are the causes? Almost all URIs are caused by viruses. A virus is a type of germ and can spread from one person to another. What increases the risk? You may be at risk for a URI if:  You smoke.  You have chronic heart or lung disease.  You have a weakened defense (immune) system.  You are very young or very old.  You have nasal allergies or asthma.  You work in crowded or poorly ventilated areas.  You work in health care facilities or schools. What are the signs or symptoms? Symptoms typically develop 2-3 days after you come in contact with a cold virus. Most viral URIs last 7-10 days. However, viral URIs from the influenza virus (flu virus) can last 14-18 days and are typically more severe. Symptoms may include:  Runny or  stuffy (congested) nose.  Sneezing.  Cough.  Sore throat.  Headache.  Fatigue.  Fever.  Loss of appetite.  Pain in your forehead, behind your eyes, and over your cheekbones (sinus pain).  Muscle aches. How is this diagnosed? Your health care provider may diagnose a URI by:  Physical exam.  Tests to check that your symptoms are not due to another condition such as:  Strep throat.  Sinusitis.  Pneumonia.  Asthma. How is this treated? A URI goes away on its own with time. It cannot be cured with medicines, but medicines may be prescribed or recommended to relieve symptoms. Medicines may help:  Reduce your fever.  Reduce your cough.  Relieve nasal congestion. Follow these instructions at home:  Take medicines only as directed by your health care provider.  Gargle warm saltwater or take cough drops to comfort your throat as directed by your health care provider.  Use a warm mist humidifier or inhale steam from a shower to increase air moisture. This may make it easier to breathe.  Drink enough fluid to keep your urine clear or pale yellow.  Eat soups and other clear broths and maintain good nutrition.  Rest as needed.  Return to work when your temperature has returned to normal or as your health care provider advises. You may need to stay home longer to avoid infecting others. You can also use a face mask and careful hand washing to prevent spread of the virus.  Increase the usage of your inhaler if you have asthma.  Do not use any tobacco products, including cigarettes, chewing tobacco, or electronic cigarettes. If you need help quitting, ask your health care provider. How is this prevented? The best way to protect yourself from getting a cold is to practice good hygiene.  Avoid oral or hand contact with people with cold symptoms.  Wash your hands often if contact occurs. There is no clear evidence that vitamin C, vitamin E, echinacea, or exercise reduces  the chance of developing a cold. However, it is always recommended to get plenty of rest, exercise, and practice good nutrition. Contact a health care provider if:  You are getting worse rather than better.  Your symptoms are not controlled by medicine.  You have chills.  You have worsening shortness of breath.  You have brown or red mucus.  You have yellow or brown nasal discharge.  You have pain in your face, especially when you bend forward.  You have a fever.  You have swollen neck glands.  You have pain while swallowing.  You have white areas in the back of your throat. Get help right away if:  You have severe or persistent:  Headache.  Ear pain.  Sinus pain.  Chest pain.  You have chronic lung disease and any of the following:  Wheezing.  Prolonged cough.  Coughing up blood.  A change in your usual mucus.  You have a stiff neck.  You have changes in your:  Vision.  Hearing.  Thinking.  Mood. This information is not intended to replace advice given to you by your health care provider. Make sure you discuss any questions you have with your health care provider. Document Released: 01/12/2001 Document Revised: 03/21/2016 Document Reviewed: 10/24/2013 Elsevier Interactive Patient Education  2017 [employer].

## 2016-08-06 NOTE — Progress Notes (Signed)
   Subjective:    Patient ID: Sharon Zavala, female    DOB: Dec 30, 1940, 76 y.o.   MRN: [id number]  HPI The patient is a 76 YO female coming in for cough and cold symptoms. Going on almost 4 days. Overall stable to improving over that time. She started with some sinus pressure and cough. Still having some cough although better today. No SOB. No energy and not doing much. Now sinus pressure is much better and she is still having some drainage. She denies sick contacts but around the public Friday. Cough is non-productive.  Review of Systems  Constitutional: Positive for activity change and fatigue. Negative for appetite change, chills, fever and unexpected weight change.  HENT: Positive for congestion and sinus pressure. Negative for ear discharge, ear pain, postnasal drip, rhinorrhea, sinus pain, sore throat and trouble swallowing.   Eyes: Negative.   Respiratory: Positive for cough. Negative for chest tightness, shortness of breath and wheezing.   Cardiovascular: Negative for chest pain, palpitations and leg swelling.  Gastrointestinal: Negative.   Musculoskeletal: Negative.       Objective:   Physical Exam  Constitutional: She is oriented to person, place, and time. She appears well-developed and well-nourished.  HENT:  Head: Normocephalic and atraumatic.  Oropharynx with redness and clear drainage, nose without crusting, no sinus pressure on exam.   Eyes: EOM are normal.  Neck: Normal range of motion.  Cardiovascular: Normal rate and regular rhythm.   Pulmonary/Chest: Effort normal and breath sounds normal. No respiratory distress. She has no wheezes. She has no rales.  Abdominal: Soft.  Lymphadenopathy:    She has no cervical adenopathy.  Neurological: She is alert and oriented to person, place, and time.  Skin: Skin is warm and dry.   Vitals:   08/06/16 0955  BP: 140/68  Pulse: (!) 121  Resp: 14  Temp: 98.1 F (36.7 C)  TempSrc: Oral  SpO2: 97%  Weight: 162 lb (73.5  kg)  Height: 5\' 3"  (1.6 m)      Assessment & Plan:

## 2016-08-06 NOTE — Progress Notes (Signed)
Pre visit review using our clinic review tool, if applicable. No additional management support is needed unless otherwise documented below in the visit note. 

## 2016-08-13 ENCOUNTER — Telehealth: Payer: Self-pay | Admitting: Internal Medicine

## 2016-08-13 NOTE — Telephone Encounter (Signed)
Has she started zyrtec? Is she no better or just not fully recovered?

## 2016-08-13 NOTE — Telephone Encounter (Signed)
Patient states feeling a little better but not fully recovered, has not taken zyrtec but has taken mucinex.  Please advise

## 2016-08-13 NOTE — Telephone Encounter (Signed)
Coughing,chest congestion/sinus, denied fever. Pt is not better from last ov, please advise on what to do (pt was tell to call our office if she is not better).

## 2016-08-13 NOTE — Telephone Encounter (Signed)
Patient contacted and stated awareness 

## 2016-08-13 NOTE — Telephone Encounter (Signed)
She needs to start zyrtec as instructed as this will help with the congestion and the cough. Can continue mucinex if she wants.

## 2016-10-12 ENCOUNTER — Other Ambulatory Visit (INDEPENDENT_AMBULATORY_CARE_PROVIDER_SITE_OTHER): Payer: Medicare HMO

## 2016-10-12 ENCOUNTER — Ambulatory Visit (INDEPENDENT_AMBULATORY_CARE_PROVIDER_SITE_OTHER): Payer: Medicare HMO | Admitting: Internal Medicine

## 2016-10-12 ENCOUNTER — Encounter: Payer: Self-pay | Admitting: Internal Medicine

## 2016-10-12 VITALS — BP 138/76 | HR 90 | Temp 98.4°F | Ht 63.0 in | Wt 165.0 lb

## 2016-10-12 DIAGNOSIS — E785 Hyperlipidemia, unspecified: Secondary | ICD-10-CM | POA: Diagnosis not present

## 2016-10-12 DIAGNOSIS — I10 Essential (primary) hypertension: Secondary | ICD-10-CM | POA: Diagnosis not present

## 2016-10-12 DIAGNOSIS — Z0000 Encounter for general adult medical examination without abnormal findings: Secondary | ICD-10-CM

## 2016-10-12 LAB — COMPREHENSIVE METABOLIC PANEL
ALT: 14 U/L (ref 0–35)
AST: 13 U/L (ref 0–37)
Albumin: 4.3 g/dL (ref 3.5–5.2)
Alkaline Phosphatase: 90 U/L (ref 39–117)
BILIRUBIN TOTAL: 0.7 mg/dL (ref 0.2–1.2)
BUN: 16 mg/dL (ref 6–23)
CALCIUM: 10.2 mg/dL (ref 8.4–10.5)
CO2: 31 meq/L (ref 19–32)
Chloride: 102 mEq/L (ref 96–112)
Creatinine, Ser: 0.89 mg/dL (ref 0.40–1.20)
GFR: 65.84 mL/min (ref >60.00)
GLUCOSE: 93 mg/dL (ref 70–99)
Potassium: 4.2 mEq/L (ref 3.5–5.1)
Sodium: 142 mEq/L (ref 135–145)
Total Protein: 7.1 g/dL (ref 6.0–8.3)

## 2016-10-12 LAB — CBC
HEMATOCRIT: 45.4 % (ref 36.0–46.0)
Hemoglobin: 15.5 g/dL — ABNORMAL HIGH (ref 12.0–15.0)
MCHC: 34.1 g/dL (ref 30.0–36.0)
MCV: 93.4 fl (ref 78.0–100.0)
Platelets: 365 10*3/uL (ref 150.0–400.0)
RBC: 4.86 Mil/uL (ref 3.87–5.11)
RDW: 14 % (ref 11.5–15.5)
WBC: 6.4 10*3/uL (ref 4.0–10.5)

## 2016-10-12 LAB — LIPID PANEL
CHOL/HDL RATIO: 3
Cholesterol: 225 mg/dL — ABNORMAL HIGH (ref 0–200)
HDL: 68.3 mg/dL (ref >39.00)
LDL CALC: 132 mg/dL — AB (ref 0–99)
NONHDL: 156.65
TRIGLYCERIDES: 125 mg/dL (ref 0.0–149.0)
VLDL: 25 mg/dL (ref 0.0–40.0)

## 2016-10-12 NOTE — Progress Notes (Signed)
   Subjective:    Patient ID: Sharon Zavala, female    DOB: 08-12-1940, 76 y.o.   MRN: [id number]  HPI Here for medicare wellness, no new complaints. Please see A/P for status and treatment of chronic medical problems.   Diet: heart healthy  Physical activity: sedentary Depression/mood screen: negative Hearing: intact to whispered voice Visual acuity: grossly normal, performs annual eye exam  ADLs: capable Fall risk: low Home safety: good Cognitive evaluation: intact to orientation, naming, recall and repetition EOL planning: adv directives discussed  I have personally reviewed and have noted 1. The patient's medical and social history - reviewed today no changes 2. Their use of alcohol, tobacco or illicit drugs 3. Their current medications and supplements 4. The patient's functional ability including ADL's, fall risks, home safety risks and hearing or visual impairment. 5. Diet and physical activities 6. Evidence for depression or mood disorders 7. Care team reviewed and updated (available in snapshot)  Review of Systems  Constitutional: Negative.   HENT: Negative.   Eyes: Negative.   Respiratory: Negative for cough, chest tightness and shortness of breath.   Cardiovascular: Negative for chest pain, palpitations and leg swelling.  Gastrointestinal: Negative for abdominal distention, abdominal pain, constipation, diarrhea, nausea and vomiting.  Musculoskeletal: Negative.   Skin: Negative.   Neurological: Negative.   Psychiatric/Behavioral: Negative.       Objective:   Physical Exam  Constitutional: She is oriented to person, place, and time. She appears well-developed and well-nourished.  HENT:  Head: Normocephalic and atraumatic.  Eyes: EOM are normal.  Neck: Normal range of motion.  Cardiovascular: Normal rate and regular rhythm.   Pulmonary/Chest: Effort normal and breath sounds normal. No respiratory distress. She has no wheezes. She has no rales.  Abdominal:  Soft. Bowel sounds are normal. She exhibits no distension. There is no tenderness. There is no rebound.  Musculoskeletal: She exhibits no edema.  Neurological: She is alert and oriented to person, place, and time. Coordination normal.  Skin: Skin is warm and dry.  Psychiatric: She has a normal mood and affect.   Vitals:   10/12/16 1301  BP: 138/76  Pulse: 90  Temp: 98.4 F (36.9 C)  TempSrc: Oral  SpO2: 96%  Weight: 165 lb (74.8 kg)  Height: 5\' 3"  (1.6 m)   EKG: Rate 73, sinus, low voltage, mild right axis versus low voltage, no st or t wave changes, intervals normal. No significant change from 2013.    Assessment & Plan:

## 2016-10-12 NOTE — Assessment & Plan Note (Signed)
Checking lipid panel for LDL goal <130. Not on meds at this time. Adjust as needed.

## 2016-10-12 NOTE — Assessment & Plan Note (Signed)
Tetanus and flu and pneumonia up to date. Has had old shingles shot and counseled on the new shingrix which is coming out including indication for immunization. Counseled on sun safety and mole surveillance. Colonoscopy due next year 2019. Given 10 year screening recommendations.

## 2016-10-12 NOTE — Patient Instructions (Signed)
Your EKG is the same as from 2013. We are checking the labs today and will call you back with the results.   Get back to exercise at least 3-4 times per week to stay healthy.   Health Maintenance, Female Adopting a healthy lifestyle and getting preventive care can go a long way to promote health and wellness. Talk with your health care provider about what schedule of regular examinations is right for you. This is a good chance for you to check in with your provider about disease prevention and staying healthy. In between checkups, there are plenty of things you can do on your own. Experts have done a lot of research about which lifestyle changes and preventive measures are most likely to keep you healthy. Ask your health care provider for more information. Weight and diet Eat a healthy diet  Be sure to include plenty of vegetables, fruits, low-fat dairy products, and lean protein.  Do not eat a lot of foods high in solid fats, added sugars, or salt.  Get regular exercise. This is one of the most important things you can do for your health.  Most adults should exercise for at least 150 minutes each week. The exercise should increase your heart rate and make you sweat (moderate-intensity exercise).  Most adults should also do strengthening exercises at least twice a week. This is in addition to the moderate-intensity exercise. Maintain a healthy weight  Body mass index (BMI) is a measurement that can be used to identify possible weight problems. It estimates body fat based on height and weight. Your health care provider can help determine your BMI and help you achieve or maintain a healthy weight.  For females 33 years of age and older:  A BMI below 18.5 is considered underweight.  A BMI of 18.5 to 24.9 is normal.  A BMI of 25 to 29.9 is considered overweight.  A BMI of 30 and above is considered obese. Watch levels of cholesterol and blood lipids  You should start having your blood  tested for lipids and cholesterol at 75 years of age, then have this test every 5 years.  You may need to have your cholesterol levels checked more often if:  Your lipid or cholesterol levels are high.  You are older than 75 years of age.  You are at high risk for heart disease. Cancer screening Lung Cancer  Lung cancer screening is recommended for adults 98-15 years old who are at high risk for lung cancer because of a history of smoking.  A yearly low-dose CT scan of the lungs is recommended for people who:  Currently smoke.  Have quit within the past 15 years.  Have at least a 30-pack-year history of smoking. A pack year is smoking an average of one pack of cigarettes a day for 1 year.  Yearly screening should continue until it has been 15 years since you quit.  Yearly screening should stop if you develop a health problem that would prevent you from having lung cancer treatment. Breast Cancer  Practice breast self-awareness. This means understanding how your breasts normally appear and feel.  It also means doing regular breast self-exams. Let your health care provider know about any changes, no matter how small.  If you are in your 20s or 30s, you should have a clinical breast exam (CBE) by a health care provider every 1-3 years as part of a regular health exam.  If you are 30 or older, have a CBE every year.  Also consider having a breast X-ray (mammogram) every year.  If you have a family history of breast cancer, talk to your health care provider about genetic screening.  If you are at high risk for breast cancer, talk to your health care provider about having an MRI and a mammogram every year.  Breast cancer gene (BRCA) assessment is recommended for women who have family members with BRCA-related cancers. BRCA-related cancers include:  Breast.  Ovarian.  Tubal.  Peritoneal cancers.  Results of the assessment will determine the need for genetic counseling and  BRCA1 and BRCA2 testing. Cervical Cancer  Your health care provider may recommend that you be screened regularly for cancer of the pelvic organs (ovaries, uterus, and vagina). This screening involves a pelvic examination, including checking for microscopic changes to the surface of your cervix (Pap test). You may be encouraged to have this screening done every 3 years, beginning at age 26.  For women ages 47-65, health care providers may recommend pelvic exams and Pap testing every 3 years, or they may recommend the Pap and pelvic exam, combined with testing for human papilloma virus (HPV), every 5 years. Some types of HPV increase your risk of cervical cancer. Testing for HPV may also be done on women of any age with unclear Pap test results.  Other health care providers may not recommend any screening for nonpregnant women who are considered low risk for pelvic cancer and who do not have symptoms. Ask your health care provider if a screening pelvic exam is right for you.  If you have had past treatment for cervical cancer or a condition that could lead to cancer, you need Pap tests and screening for cancer for at least 20 years after your treatment. If Pap tests have been discontinued, your risk factors (such as having a new sexual partner) need to be reassessed to determine if screening should resume. Some women have medical problems that increase the chance of getting cervical cancer. In these cases, your health care provider may recommend more frequent screening and Pap tests. Colorectal Cancer  This type of cancer can be detected and often prevented.  Routine colorectal cancer screening usually begins at 75 years of age and continues through 75 years of age.  Your health care provider may recommend screening at an earlier age if you have risk factors for colon cancer.  Your health care provider may also recommend using home test kits to check for hidden blood in the stool.  A small camera at  the end of a tube can be used to examine your colon directly (sigmoidoscopy or colonoscopy). This is done to check for the earliest forms of colorectal cancer.  Routine screening usually begins at age 1.  Direct examination of the colon should be repeated every 5-10 years through 75 years of age. However, you may need to be screened more often if early forms of precancerous polyps or small growths are found. Skin Cancer  Check your skin from head to toe regularly.  Tell your health care provider about any new moles or changes in moles, especially if there is a change in a mole's shape or color.  Also tell your health care provider if you have a mole that is larger than the size of a pencil eraser.  Always use sunscreen. Apply sunscreen liberally and repeatedly throughout the day.  Protect yourself by wearing long sleeves, pants, a wide-brimmed hat, and sunglasses whenever you are outside. Heart disease, diabetes, and high blood pressure  High blood pressure causes heart disease and increases the risk of stroke. High blood pressure is more likely to develop in:  People who have blood pressure in the high end of the normal range (130-139/85-89 mm Hg).  People who are overweight or obese.  People who are African American.  If you are 56-48 years of age, have your blood pressure checked every 3-5 years. If you are 64 years of age or older, have your blood pressure checked every year. You should have your blood pressure measured twice-once when you are at a hospital or clinic, and once when you are not at a hospital or clinic. Record the average of the two measurements. To check your blood pressure when you are not at a hospital or clinic, you can use:  An automated blood pressure machine at a pharmacy.  A home blood pressure monitor.  If you are between 63 years and 73 years old, ask your health care provider if you should take aspirin to prevent strokes.  Have regular diabetes  screenings. This involves taking a blood sample to check your fasting blood sugar level.  If you are at a normal weight and have a low risk for diabetes, have this test once every three years after 75 years of age.  If you are overweight and have a high risk for diabetes, consider being tested at a younger age or more often. Preventing infection Hepatitis B  If you have a higher risk for hepatitis B, you should be screened for this virus. You are considered at high risk for hepatitis B if:  You were born in a country where hepatitis B is common. Ask your health care provider which countries are considered high risk.  Your parents were born in a high-risk country, and you have not been immunized against hepatitis B (hepatitis B vaccine).  You have HIV or AIDS.  You use needles to inject street drugs.  You live with someone who has hepatitis B.  You have had sex with someone who has hepatitis B.  You get hemodialysis treatment.  You take certain medicines for conditions, including cancer, organ transplantation, and autoimmune conditions. Hepatitis C  Blood testing is recommended for:  Everyone born from [birth year] through 1964.  Anyone with known risk factors for hepatitis C. Sexually transmitted infections (STIs)  You should be screened for sexually transmitted infections (STIs) including gonorrhea and chlamydia if:  You are sexually active and are younger than 75 years of age.  You are older than 75 years of age and your health care provider tells you that you are at risk for this type of infection.  Your sexual activity has changed since you were last screened and you are at an increased risk for chlamydia or gonorrhea. Ask your health care provider if you are at risk.  If you do not have HIV, but are at risk, it may be recommended that you take a prescription medicine daily to prevent HIV infection. This is called pre-exposure prophylaxis (PrEP). You are considered at risk  if:  You are sexually active and do not regularly use condoms or know the HIV status of your partner(s).  You take drugs by injection.  You are sexually active with a partner who has HIV. Talk with your health care provider about whether you are at high risk of being infected with HIV. If you choose to begin PrEP, you should first be tested for HIV. You should then be tested every 3 months for as long  as you are taking PrEP. Pregnancy  If you are premenopausal and you may become pregnant, ask your health care provider about preconception counseling.  If you may become pregnant, take 400 to 800 micrograms (mcg) of folic acid every day.  If you want to prevent pregnancy, talk to your health care provider about birth control (contraception). Osteoporosis and menopause  Osteoporosis is a disease in which the bones lose minerals and strength with aging. This can result in serious bone fractures. Your risk for osteoporosis can be identified using a bone density scan.  If you are 35 years of age or older, or if you are at risk for osteoporosis and fractures, ask your health care provider if you should be screened.  Ask your health care provider whether you should take a calcium or vitamin D supplement to lower your risk for osteoporosis.  Menopause may have certain physical symptoms and risks.  Hormone replacement therapy may reduce some of these symptoms and risks. Talk to your health care provider about whether hormone replacement therapy is right for you. Follow these instructions at home:  Schedule regular health, dental, and eye exams.  Stay current with your immunizations.  Do not use any tobacco products including cigarettes, chewing tobacco, or electronic cigarettes.  If you are pregnant, do not drink alcohol.  If you are breastfeeding, limit how much and how often you drink alcohol.  Limit alcohol intake to no more than 1 drink per day for nonpregnant women. One drink equals  12 ounces of beer, 5 ounces of wine, or 1 ounces of hard liquor.  Do not use street drugs.  Do not share needles.  Ask your health care provider for help if you need support or information about quitting drugs.  Tell your health care provider if you often feel depressed.  Tell your health care provider if you have ever been abused or do not feel safe at home. This information is not intended to replace advice given to you by your health care provider. Make sure you discuss any questions you have with your health care provider. Document Released: 02/01/2011 Document Revised: 12/25/2015 Document Reviewed: 04/22/2015 Elsevier Interactive Patient Education  2017 [employer].

## 2016-10-12 NOTE — Assessment & Plan Note (Signed)
EKG done without changes from prior. BP at goal on her lisinopril/hctz. Checking CMP and adjust as needed.

## 2016-10-12 NOTE — Progress Notes (Signed)
Pre visit review using our clinic review tool, if applicable. No additional management support is needed unless otherwise documented below in the visit note. 

## 2017-04-29 DIAGNOSIS — Z23 Encounter for immunization: Secondary | ICD-10-CM | POA: Diagnosis not present

## 2017-07-21 DIAGNOSIS — Z6830 Body mass index (BMI) 30.0-30.9, adult: Secondary | ICD-10-CM | POA: Diagnosis not present

## 2017-07-21 DIAGNOSIS — Z853 Personal history of malignant neoplasm of breast: Secondary | ICD-10-CM | POA: Diagnosis not present

## 2017-07-21 DIAGNOSIS — I10 Essential (primary) hypertension: Secondary | ICD-10-CM | POA: Diagnosis not present

## 2017-07-21 DIAGNOSIS — Z7982 Long term (current) use of aspirin: Secondary | ICD-10-CM | POA: Diagnosis not present

## 2017-07-21 DIAGNOSIS — Z0000 Encounter for general adult medical examination without abnormal findings: Secondary | ICD-10-CM | POA: Diagnosis not present

## 2017-08-03 DIAGNOSIS — L814 Other melanin hyperpigmentation: Secondary | ICD-10-CM | POA: Diagnosis not present

## 2017-08-03 DIAGNOSIS — D2261 Melanocytic nevi of right upper limb, including shoulder: Secondary | ICD-10-CM | POA: Diagnosis not present

## 2017-08-03 DIAGNOSIS — D1801 Hemangioma of skin and subcutaneous tissue: Secondary | ICD-10-CM | POA: Diagnosis not present

## 2017-08-03 DIAGNOSIS — D225 Melanocytic nevi of trunk: Secondary | ICD-10-CM | POA: Diagnosis not present

## 2017-08-03 DIAGNOSIS — D235 Other benign neoplasm of skin of trunk: Secondary | ICD-10-CM | POA: Diagnosis not present

## 2017-08-03 DIAGNOSIS — L570 Actinic keratosis: Secondary | ICD-10-CM | POA: Diagnosis not present

## 2017-08-03 DIAGNOSIS — Z85828 Personal history of other malignant neoplasm of skin: Secondary | ICD-10-CM | POA: Diagnosis not present

## 2017-08-03 DIAGNOSIS — L821 Other seborrheic keratosis: Secondary | ICD-10-CM | POA: Diagnosis not present

## 2017-08-03 DIAGNOSIS — C44519 Basal cell carcinoma of skin of other part of trunk: Secondary | ICD-10-CM | POA: Diagnosis not present

## 2017-08-03 DIAGNOSIS — D2262 Melanocytic nevi of left upper limb, including shoulder: Secondary | ICD-10-CM | POA: Diagnosis not present

## 2017-09-05 ENCOUNTER — Telehealth: Payer: Self-pay | Admitting: Internal Medicine

## 2017-09-05 NOTE — Telephone Encounter (Signed)
Patients husband had the cologuard test done and it came back positive and she would like to know if she needs to get this done as well. Please advise and call back in regard

## 2017-09-06 DIAGNOSIS — C44519 Basal cell carcinoma of skin of other part of trunk: Secondary | ICD-10-CM | POA: Diagnosis not present

## 2017-09-06 DIAGNOSIS — Z85828 Personal history of other malignant neoplasm of skin: Secondary | ICD-10-CM | POA: Diagnosis not present

## 2017-09-06 DIAGNOSIS — L988 Other specified disorders of the skin and subcutaneous tissue: Secondary | ICD-10-CM | POA: Diagnosis not present

## 2017-09-06 DIAGNOSIS — D1801 Hemangioma of skin and subcutaneous tissue: Secondary | ICD-10-CM | POA: Diagnosis not present

## 2017-09-06 DIAGNOSIS — D235 Other benign neoplasm of skin of trunk: Secondary | ICD-10-CM | POA: Diagnosis not present

## 2017-09-06 NOTE — Telephone Encounter (Signed)
She is currently up to date on colon screening but we can discuss at her next physical more (mid March or later).

## 2017-09-06 NOTE — Telephone Encounter (Signed)
LVM for patient informing her of MD response and that her CPE is on 10/14/2017 at 3pm

## 2017-09-29 ENCOUNTER — Other Ambulatory Visit: Payer: Self-pay | Admitting: Internal Medicine

## 2017-10-07 ENCOUNTER — Inpatient Hospital Stay (HOSPITAL_COMMUNITY)
Admission: EM | Admit: 2017-10-07 | Discharge: 2017-10-11 | DRG: 087 | Disposition: A | Discharge status: Home Health | Payer: Medicare HMO | Attending: Neurological Surgery | Admitting: Neurological Surgery

## 2017-10-07 ENCOUNTER — Encounter (HOSPITAL_COMMUNITY): Payer: Self-pay | Admitting: Emergency Medicine

## 2017-10-07 ENCOUNTER — Other Ambulatory Visit: Payer: Self-pay

## 2017-10-07 ENCOUNTER — Emergency Department (HOSPITAL_COMMUNITY): Payer: Medicare HMO

## 2017-10-07 DIAGNOSIS — S062X0A Diffuse traumatic brain injury without loss of consciousness, initial encounter: Secondary | ICD-10-CM | POA: Diagnosis present

## 2017-10-07 DIAGNOSIS — S065X0A Traumatic subdural hemorrhage without loss of consciousness, initial encounter: Secondary | ICD-10-CM | POA: Diagnosis not present

## 2017-10-07 DIAGNOSIS — Y92015 Private garage of single-family (private) house as the place of occurrence of the external cause: Secondary | ICD-10-CM

## 2017-10-07 DIAGNOSIS — W1830XA Fall on same level, unspecified, initial encounter: Secondary | ICD-10-CM | POA: Diagnosis present

## 2017-10-07 DIAGNOSIS — Z803 Family history of malignant neoplasm of breast: Secondary | ICD-10-CM | POA: Diagnosis not present

## 2017-10-07 DIAGNOSIS — I10 Essential (primary) hypertension: Secondary | ICD-10-CM | POA: Diagnosis not present

## 2017-10-07 DIAGNOSIS — Z781 Physical restraint status: Secondary | ICD-10-CM | POA: Diagnosis not present

## 2017-10-07 DIAGNOSIS — Z9013 Acquired absence of bilateral breasts and nipples: Secondary | ICD-10-CM | POA: Diagnosis not present

## 2017-10-07 DIAGNOSIS — S0990XA Unspecified injury of head, initial encounter: Secondary | ICD-10-CM | POA: Diagnosis not present

## 2017-10-07 DIAGNOSIS — R4182 Altered mental status, unspecified: Secondary | ICD-10-CM

## 2017-10-07 DIAGNOSIS — I609 Nontraumatic subarachnoid hemorrhage, unspecified: Secondary | ICD-10-CM

## 2017-10-07 DIAGNOSIS — Z8042 Family history of malignant neoplasm of prostate: Secondary | ICD-10-CM | POA: Diagnosis not present

## 2017-10-07 DIAGNOSIS — R4020 Unspecified coma: Secondary | ICD-10-CM | POA: Diagnosis not present

## 2017-10-07 DIAGNOSIS — S066X0A Traumatic subarachnoid hemorrhage without loss of consciousness, initial encounter: Secondary | ICD-10-CM | POA: Diagnosis not present

## 2017-10-07 DIAGNOSIS — S06330A Contusion and laceration of cerebrum, unspecified, without loss of consciousness, initial encounter: Secondary | ICD-10-CM

## 2017-10-07 DIAGNOSIS — S065XAA Traumatic subdural hemorrhage with loss of consciousness status unknown, initial encounter: Secondary | ICD-10-CM

## 2017-10-07 DIAGNOSIS — S02119A Unspecified fracture of occiput, initial encounter for closed fracture: Principal | ICD-10-CM | POA: Diagnosis present

## 2017-10-07 DIAGNOSIS — R229 Localized swelling, mass and lump, unspecified: Secondary | ICD-10-CM | POA: Diagnosis not present

## 2017-10-07 DIAGNOSIS — M549 Dorsalgia, unspecified: Secondary | ICD-10-CM

## 2017-10-07 DIAGNOSIS — M545 Low back pain: Secondary | ICD-10-CM | POA: Diagnosis not present

## 2017-10-07 DIAGNOSIS — S065X9A Traumatic subdural hemorrhage with loss of consciousness of unspecified duration, initial encounter: Secondary | ICD-10-CM

## 2017-10-07 DIAGNOSIS — S199XXA Unspecified injury of neck, initial encounter: Secondary | ICD-10-CM | POA: Diagnosis not present

## 2017-10-07 LAB — CBC WITH DIFFERENTIAL/PLATELET
BASOS ABS: 0 10*3/uL (ref 0.0–0.1)
BASOS PCT: 0 %
EOS PCT: 0 %
Eosinophils Absolute: 0 10*3/uL (ref 0.0–0.7)
HCT: 41.4 % (ref 36.0–46.0)
Hemoglobin: 14.2 g/dL (ref 12.0–15.0)
LYMPHS PCT: 12 %
Lymphs Abs: 1.6 10*3/uL (ref 0.7–4.0)
MCH: 31.6 pg (ref 26.0–34.0)
MCHC: 34.3 g/dL (ref 30.0–36.0)
MCV: 92.2 fL (ref 78.0–100.0)
Monocytes Absolute: 0.7 10*3/uL (ref 0.1–1.0)
Monocytes Relative: 5 %
Neutro Abs: 11.8 10*3/uL — ABNORMAL HIGH (ref 1.7–7.7)
Neutrophils Relative %: 83 %
PLATELETS: 304 10*3/uL (ref 150–400)
RBC: 4.49 MIL/uL (ref 3.87–5.11)
RDW: 13.2 % (ref 11.5–15.5)
WBC: 14.2 10*3/uL — AB (ref 4.0–10.5)

## 2017-10-07 LAB — I-STAT TROPONIN, ED: TROPONIN I, POC: 0 ng/mL (ref 0.00–0.08)

## 2017-10-07 LAB — I-STAT CHEM 8, ED
BUN: 15 mg/dL (ref 6–20)
CHLORIDE: 101 mmol/L (ref 101–111)
Calcium, Ion: 1.13 mmol/L — ABNORMAL LOW (ref 1.15–1.40)
Creatinine, Ser: 0.7 mg/dL (ref 0.44–1.00)
GLUCOSE: 128 mg/dL — AB (ref 65–99)
HEMATOCRIT: 43 % (ref 36.0–46.0)
HEMOGLOBIN: 14.6 g/dL (ref 12.0–15.0)
POTASSIUM: 3 mmol/L — AB (ref 3.5–5.1)
SODIUM: 140 mmol/L (ref 135–145)
TCO2: 25 mmol/L (ref 22–32)

## 2017-10-07 LAB — GLUCOSE, CAPILLARY: Glucose-Capillary: 127 mg/dL — ABNORMAL HIGH (ref 65–99)

## 2017-10-07 MED ORDER — MORPHINE SULFATE (PF) 4 MG/ML IV SOLN
1.0000 mg | INTRAVENOUS | Status: DC | PRN
  Administered 2017-10-08 – 2017-10-09 (×5): 1 mg via INTRAVENOUS
  Filled 2017-10-07 (×5): qty 1

## 2017-10-07 MED ORDER — PANTOPRAZOLE SODIUM 40 MG IV SOLR
40.0000 mg | Freq: Every day | INTRAVENOUS | Status: DC
  Administered 2017-10-08: 40 mg via INTRAVENOUS
  Filled 2017-10-07: qty 40

## 2017-10-07 MED ORDER — HYDRALAZINE HCL 20 MG/ML IJ SOLN
10.0000 mg | INTRAMUSCULAR | Status: DC | PRN
  Administered 2017-10-07: 10 mg via INTRAVENOUS
  Filled 2017-10-07: qty 1

## 2017-10-07 MED ORDER — ONDANSETRON HCL 4 MG/2ML IJ SOLN
4.0000 mg | Freq: Four times a day (QID) | INTRAMUSCULAR | Status: DC | PRN
  Administered 2017-10-07: 4 mg via INTRAVENOUS

## 2017-10-07 MED ORDER — HYDROCHLOROTHIAZIDE 12.5 MG PO CAPS
12.5000 mg | ORAL_CAPSULE | Freq: Every day | ORAL | Status: DC
  Administered 2017-10-09 – 2017-10-11 (×3): 12.5 mg via ORAL
  Filled 2017-10-07 (×3): qty 1

## 2017-10-07 MED ORDER — SODIUM CHLORIDE 0.9 % IV BOLUS (SEPSIS)
500.0000 mL | Freq: Once | INTRAVENOUS | Status: AC
  Administered 2017-10-07: 500 mL via INTRAVENOUS

## 2017-10-07 MED ORDER — ONDANSETRON 4 MG PO TBDP: 4.0000 mg | ORAL_TABLET | Freq: Four times a day (QID) | ORAL | Status: DC | PRN

## 2017-10-07 MED ORDER — PANTOPRAZOLE SODIUM 40 MG PO TBEC
40.0000 mg | DELAYED_RELEASE_TABLET | Freq: Every day | ORAL | Status: DC
  Administered 2017-10-09 – 2017-10-11 (×3): 40 mg via ORAL
  Filled 2017-10-07 (×3): qty 1

## 2017-10-07 MED ORDER — ACETAMINOPHEN 325 MG PO TABS
650.0000 mg | ORAL_TABLET | Freq: Once | ORAL | Status: AC
Start: 2017-10-07 — End: 2017-10-07
  Administered 2017-10-07: 650 mg via ORAL
  Filled 2017-10-07: qty 2

## 2017-10-07 MED ORDER — POTASSIUM CHLORIDE IN NACL 20-0.9 MEQ/L-% IV SOLN
INTRAVENOUS | Status: DC
  Administered 2017-10-07 – 2017-10-08 (×2): via INTRAVENOUS
  Filled 2017-10-07 (×4): qty 1000

## 2017-10-07 MED ORDER — ONDANSETRON HCL 4 MG/2ML IJ SOLN
INTRAMUSCULAR | Status: AC
  Filled 2017-10-07: qty 2

## 2017-10-07 MED ORDER — LISINOPRIL 10 MG PO TABS
10.0000 mg | ORAL_TABLET | Freq: Every day | ORAL | Status: DC
  Administered 2017-10-09 – 2017-10-11 (×3): 10 mg via ORAL
  Filled 2017-10-07 (×3): qty 1

## 2017-10-07 MED ORDER — LISINOPRIL-HYDROCHLOROTHIAZIDE 10-12.5 MG PO TABS: 1.0000 | ORAL_TABLET | Freq: Every day | ORAL | Status: DC

## 2017-10-07 NOTE — ED Notes (Signed)
Patient transported to CT 

## 2017-10-07 NOTE — Progress Notes (Signed)
Patient ID: Sharon Zavala, female   DOB: 11/12/1940, 77 y.o.   MRN: [id number] Pt no longer somnolent, now awake and alert, agitated and confuse, MAE, may have trouble naming but FC and repeats, pupils ok, oriented to person only. CT head in am

## 2017-10-07 NOTE — ED Triage Notes (Signed)
Pt in from home via [facility] after tripping, falling and losing consciousness for a brief period. Contusion present to L posterior head. Per family, pt is repetitive and altered, not her usual a&ox4. C-collar applied to neck, not on thinners. BP 146/98

## 2017-10-07 NOTE — ED Provider Notes (Signed)
[facility] EMERGENCY DEPARTMENT Provider Note   CSN: [id number] Arrival date & time: 10/07/17  1449     History   Chief Complaint Chief Complaint  Patient presents with  . Fall  . Head Injury    HPI Sharon Zavala is a 77 y.o. female.  HPI  77 year old female wrote history of breast cancer bilateral mastectomy/post chemo times 10 years ago, hypertension, daily antiplatelet therapy presents emergency department status post mechanical fall event that was witnessed by husband.  The husband states the patient was exiting the home into the driveway where that she fell backwards striking the back of her head.  Patient's husband says she was groggy but had no seizure-like activity and seemed confused.  Patient has not been ambulatory since.  No anticoagulation.  The patient denies upper or lower extremity pain.  Past Medical History:  Diagnosis Date  . 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 Routine general medical examination at a health care facility   . Unspecified essential hypertension     Patient Active Problem List   Diagnosis Date Noted  . Closed head injury 10/07/2017  . Routine health maintenance 09/24/2011  . Hyperlipidemia 01/27/2009  . Malignant neoplasm of breast (female), unspecified site 11/27/2007  . Essential hypertension 11/27/2007    Past Surgical History:  Procedure Laterality Date  . MASTECTOMY MODIFIED RADICAL  '89 & '97   Bilateral  . TONSILLECTOMY      OB History    Gravida Para Term Preterm AB Living   1 1           SAB TAB Ectopic Multiple Live Births                   Home Medications    Prior to Admission medications   Medication Sig Start Date End Date Taking? Authorizing Provider  aspirin 81 MG tablet Take 81 mg by mouth daily.   Yes [provider]  lisinopril-hydrochlorothiazide (PRINZIDE,ZESTORETIC) 10-12.5 MG tablet TAKE 1 TABLET BY MOUTH DAILY. 09/29/17  Yes Hoyt Koch, MD  acyclovir (ZOVIRAX) 400 MG tablet Take 1 tablet (400 mg total) by mouth 3 (three) times daily. Patient not taking: Reported on 10/07/2017 10/02/15   Hoyt Koch, MD    Family History Family History  Problem Relation Age of Onset  . Prostate cancer Father 80       deceased  . Other Mother 56       natural causes  . Breast cancer Sister   . Colon cancer Neg Hx   . Diabetes Neg Hx   . Coronary artery disease Neg Hx     Social History Social History   Tobacco Use  . Smoking status: Never Smoker  . Smokeless tobacco: Never Used  Substance Use Topics  . Alcohol use: Yes    Comment: occ  . Drug use: No     Allergies   Patient has no known allergies.   Review of Systems Review of Systems  Review of Systems  Constitutional: Negative for fever and chills.  HENT: Negative for ear pain, sore throat and trouble swallowing.   Eyes: Negative for pain and visual disturbance.  Respiratory: Negative for cough and shortness of breath.   Cardiovascular: Negative for chest pain and leg swelling.  Gastrointestinal: Negative for nausea, vomiting, abdominal pain and diarrhea.  Genitourinary: Negative for dysuria, urgency and frequency.  Musculoskeletal: Negative for back pain and joint swelling.  Skin: Negative for rash  and wound.  Neurological: Negative for dizziness, speech difficulty, weakness and numbness.; unclear syncope vs mechanical fall   Physical Exam Updated Vital Signs BP (!) 142/71   Pulse 82   Temp 98.1 F (36.7 C) (Oral)   Resp 19   Wt 74.8 kg (165 lb)   SpO2 98%   BMI 29.23 kg/m   Physical Exam  OnPhysical Exam Vitals:   10/07/17 1800 10/07/17 1845  BP: (!) 156/72 (!) 142/71  Pulse: 82 82  Resp: (!) 8 19  Temp:    SpO2: 93% 98%   Constitutional: Patient is in no acute distress Head: Normocephalic and atraumatic. ; midface stable; 72mm reactive equal Eyes: Extraocular motion intact, no scleral icterus Neck: Supple without meningismus,  mass, or overt JVD Respiratory: Effort normal and breath sounds normal. No respiratory distress. CV: Heart regular rate and rhythm, no obvious murmurs.  Pulses +2 and symmetric Abdomen: Soft, non-tender, non-distended MSK: Extremities are atraumatic without deformity, ROM intact Skin: contusion occiput; scalp intact Neuro: Confused to time, cranial nerves II through XII intact, negative pronator drift, "appears confused at times to follow commands, could not understand F-->N"; upper extremity/lower extremities full active range of motion muscular strength 5 out of 5 sensory intact.;  Negative slurred speech/negative facial droop.   ED Treatments / Results  Labs (all labs ordered are listed, but only abnormal results are displayed) Labs Reviewed  CBC WITH DIFFERENTIAL/PLATELET - Abnormal; Notable for the following components:      Result Value   WBC 14.2 (*)    Neutro Abs 11.8 (*)    All other components within normal limits  I-STAT CHEM 8, ED - Abnormal; Notable for the following components:   Potassium 3.0 (*)    Glucose, Bld 128 (*)    Calcium, Ion 1.13 (*)    All other components within normal limits  I-STAT TROPONIN, ED    EKG  EKG Interpretation  Date/Time:  Friday October 07 2017 15:46:19 EST Ventricular Rate:  74 PR Interval:    QRS Duration: 100 QT Interval:  422 QTC Calculation: 469 R Axis:   52 Text Interpretation:  Sinus rhythm Ventricular premature complex Low voltage, extremity leads No significant changes from prior Confirmed by Gareth Morgan [phone]) on 10/07/2017 5:34:19 PM     EKG: normal EKG, normal sinus rhythm, occasional PVC noted, unifocal.; neg QT prolongation; N axis; N intervals.   Radiology Ct Head Wo Contrast  Result Date: 10/07/2017 CLINICAL DATA:  Fall with head injury. Initial encounter. EXAM: CT HEAD WITHOUT CONTRAST CT CERVICAL SPINE WITHOUT CONTRAST TECHNIQUE: Multidetector CT imaging of the head and cervical spine was performed following the  standard protocol without intravenous contrast. Multiplanar CT image reconstructions of the cervical spine were also generated. COMPARISON:  None. FINDINGS: CT HEAD FINDINGS Brain: Scattered traumatic appearing subarachnoid blood along the cerebral and cerebellar convexities. Hemorrhagic contusion in the inferior right frontal lobe involving a maximal 3 cm diameter area. Smaller hemorrhagic contusion in the left occipital pole and left cerebellum. Thin subdural hematoma along the left cerebral convexity, left tentorium and anterior falx. The left frontal subdural measures up to 4 mm in thickness. No visible infarct. No hydrocephalus Vascular: No hyperdense vessel or unexpected calcification. Skull: Nondepressed linear skull fracture through the left paramedian occipital bone. There is a separate limited fracture through the posterior foramen magnum. Overlying scalp contusion. Sinuses/Orbits: No evidence of injury. Other: Incidental parotid calcifications CT CERVICAL SPINE FINDINGS Alignment: Normal. Skull base and vertebrae: Negative for fracture Soft tissues  and spinal canal: No prevertebral fluid or swelling. No visible canal hematoma. Disc levels: C5-6 and C6-7 degenerative disc narrowing and spurring. Asymmetric left facet degeneration and spurring with C3-4 ankylosis. Upper chest: Negative Critical Value/emergent results were called by telephone at the time of interpretation on 10/07/2017 at 4:57 pm to Dr. Willette Alma , who verbally acknowledged these results. IMPRESSION: 1. Right inferior frontal, left occipital pole, and left cerebellar hemorrhagic contusions. 2. Patchy traumatic pattern subarachnoid hemorrhage. 3. Thin subdural hematomas along the falx, left tentorium, and left cerebral convexity. 4. Nondepressed occipital bone fractures. 5. Negative for cervical spine fracture. Electronically Signed   By: Monte Fantasia M.D.   On: 10/07/2017 17:00   Ct Cervical Spine Wo Contrast  Result Date:  10/07/2017 CLINICAL DATA:  Fall with head injury. Initial encounter. EXAM: CT HEAD WITHOUT CONTRAST CT CERVICAL SPINE WITHOUT CONTRAST TECHNIQUE: Multidetector CT imaging of the head and cervical spine was performed following the standard protocol without intravenous contrast. Multiplanar CT image reconstructions of the cervical spine were also generated. COMPARISON:  None. FINDINGS: CT HEAD FINDINGS Brain: Scattered traumatic appearing subarachnoid blood along the cerebral and cerebellar convexities. Hemorrhagic contusion in the inferior right frontal lobe involving a maximal 3 cm diameter area. Smaller hemorrhagic contusion in the left occipital pole and left cerebellum. Thin subdural hematoma along the left cerebral convexity, left tentorium and anterior falx. The left frontal subdural measures up to 4 mm in thickness. No visible infarct. No hydrocephalus Vascular: No hyperdense vessel or unexpected calcification. Skull: Nondepressed linear skull fracture through the left paramedian occipital bone. There is a separate limited fracture through the posterior foramen magnum. Overlying scalp contusion. Sinuses/Orbits: No evidence of injury. Other: Incidental parotid calcifications CT CERVICAL SPINE FINDINGS Alignment: Normal. Skull base and vertebrae: Negative for fracture Soft tissues and spinal canal: No prevertebral fluid or swelling. No visible canal hematoma. Disc levels: C5-6 and C6-7 degenerative disc narrowing and spurring. Asymmetric left facet degeneration and spurring with C3-4 ankylosis. Upper chest: Negative Critical Value/emergent results were called by telephone at the time of interpretation on 10/07/2017 at 4:57 pm to Dr. Willette Alma , who verbally acknowledged these results. IMPRESSION: 1. Right inferior frontal, left occipital pole, and left cerebellar hemorrhagic contusions. 2. Patchy traumatic pattern subarachnoid hemorrhage. 3. Thin subdural hematomas along the falx, left tentorium, and left  cerebral convexity. 4. Nondepressed occipital bone fractures. 5. Negative for cervical spine fracture. Electronically Signed   By: Monte Fantasia M.D.   On: 10/07/2017 17:00    Procedures Procedures (including critical care time)  Medications Ordered in ED Medications  acetaminophen (TYLENOL) tablet 650 mg (650 mg Oral Given 10/07/17 1555)  sodium chloride 0.9 % bolus 500 mL (0 mLs Intravenous Stopped 10/07/17 1647)     Initial Impression / Assessment and Plan / ED Course  I have reviewed the triage vital signs and the nursing notes.  Pertinent labs & imaging results that were available during my care of the patient were reviewed by me and considered in my medical decision making (see chart for details).     77 year old female wrote history of breast cancer bilateral mastectomy/post chemo times 10 years ago, hypertension, daily antiplatelet therapy presents emergency department status post mechanical fall  that was witnessed by husband.  The husband states the patient was exiting the home into the driveway where that she fell backwards striking the back of her head.  Patient's husband says she was groggy but had no seizure-like activity and seemed confused.  Patient  has not been ambulatory since.  No anticoagulation.  The patient denies upper or lower extremity pain.  Denies CP/SOB/change VA/recent illness or trauma.  Patient arrives here medically stable no focal deficits has had some confusion to time and difficulty following commands.  Patient has leukocytosis of 14.2 otherwise hemoglobin 14.2, no gross electrolyte imbalance.  Troponin x1- with no findings on EKG concerning for ACS.  CT C-spine clear for fracture/dislocation.  Review of CT head shows findings consistent with traumatic intracranial hemorrhage subdural hemorrhage/subarachnoid hemorrhage, contusions and nondepressed occipital skull fx.   Patient remains him dynamically stable with systolic in the 842J.  Patient's mental status is  not had an interval decline.  Neurosurgery/Dr. Ronnald Ramp consulted plan for admission.    Final Clinical Impressions(s) / ED Diagnoses   Final diagnoses:  Closed fracture of occipital bone, unspecified laterality, unspecified occipital fracture type, initial encounter ([facility])  Altered mental status, unspecified altered mental status type  SAH (subarachnoid hemorrhage) (H[facility])  Contusion of cerebrum without loss of consciousness, unspecified laterality, initial encounter [facility])  SDH (subdural hematoma) [facility])    ED Discharge Orders    None       Willette Alma, DO 10/07/17 Laverna Peace, MD 10/08/17 1300

## 2017-10-07 NOTE — H&P (Signed)
Sharon Zavala is an 77 y.o. female.   HPI:  77 year old patient presents to the ED today after a fall in her garage. Husband states that she tripped on the steps and fell backwards on her head. He reports that she has been confused and sleepy since about 2:00pm when the incident happened. She does not have any significant medical history other than breast cancer. She is not on any anticoagulants. Difficult to get history and assessment from the patient because she is confused and lethargic. Fall was witnessed by her husband.   Past Medical History:  Diagnosis Date  . 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 Routine general medical examination at a health care facility   . Unspecified essential hypertension     Past Surgical History:  Procedure Laterality Date  . MASTECTOMY MODIFIED RADICAL  '89 & '97   Bilateral  . TONSILLECTOMY      No Known Allergies  Social History   Tobacco Use  . Smoking status: Never Smoker  . Smokeless tobacco: Never Used  Substance Use Topics  . Alcohol use: Yes    Comment: occ    Family History  Problem Relation Age of Onset  . Prostate cancer Father 58       deceased  . Other Mother 46       natural causes  . Breast cancer Sister   . Colon cancer Neg Hx   . Diabetes Neg Hx   . Coronary artery disease Neg Hx      Review of Systems  Positive ROS: headaches and NV  All other systems have been reviewed and were otherwise negative with the exception of those mentioned in the HPI and as above.  Objective: Vital signs in last 24 hours: Temp:  [98.1 F (36.7 C)] 98.1 F (36.7 C) (03/08 1601) Pulse Rate:  [71-82] 82 (03/08 1800) Resp:  [8-16] 8 (03/08 1800) BP: (149-165)/(66-79) 156/72 (03/08 1800) SpO2:  [93 %-100 %] 93 % (03/08 1800) Weight:  [74.8 kg (165 lb)] 74.8 kg (165 lb) (03/08 1504)  General Appearance: Lethargic but arouses to name, no distress, appears stated age Head: Normocephalic, without obvious  abnormality, atraumatic Eyes: PERRL, conjunctiva/corneas clear, EOM's intact, fundi benign, both eyes      Throat: not tested Neck: Supple, symmetrical, trachea midline  Back: not tested Lungs:  respirations unlabored Heart: Regular rate and rhythm Abdomen: not tested Extremities: Extremities normal, atraumatic, no cyanosis or edema Pulses: 2+ and symmetric all extremities Skin: Skin color, texture, turgor normal, no rashes or lesions  NEUROLOGIC:   Mental status: Lethargica and confused, oriented to self only. no aphasia, poor attention span  Memory and fund of knowledge Motor Exam - grossly normal, normal tone and bulk Sensory Exam - grossly normal Reflexes: symmetric, no pathologic reflexes, No Hoffman's, No clonus Coordination - grossly normal Gait - not tested Balance -not tested Cranial Nerves: I: smell Not tested  II: visual acuity  OS: na  OD: na  II: visual fields Full to confrontation  II: pupils Equal, round, reactive to light  III,VII: ptosis None  III,IV,VI: extraocular muscles  Full ROM  V: mastication Normal  V: facial light touch sensation  Normal  V,VII: corneal reflex    VII: facial muscle function - upper  Normal  VII: facial muscle function - lower Normal  VIII: hearing Not tested  IX: soft palate elevation  Normal  IX,X: gag reflex   XI: trapezius strength  5/5  XI:  sternocleidomastoid strength 5/5  XI: neck flexion strength  5/5  XII: tongue strength  Normal    Data Review Lab Results  Component Value Date   WBC 14.2 (H) 10/07/2017   HGB 14.2 10/07/2017   HGB 14.6 10/07/2017   HCT 41.4 10/07/2017   HCT 43.0 10/07/2017   MCV 92.2 10/07/2017   PLT 304 10/07/2017   Lab Results  Component Value Date   NA 140 10/07/2017   K 3.0 (L) 10/07/2017   CL 101 10/07/2017   CO2 31 10/12/2016   BUN 15 10/07/2017   CREATININE 0.70 10/07/2017   GLUCOSE 128 (H) 10/07/2017   No results found for: INR, PROTIME  Radiology: Ct Head Wo Contrast  Result  Date: 10/07/2017 CLINICAL DATA:  Fall with head injury. Initial encounter. EXAM: CT HEAD WITHOUT CONTRAST CT CERVICAL SPINE WITHOUT CONTRAST TECHNIQUE: Multidetector CT imaging of the head and cervical spine was performed following the standard protocol without intravenous contrast. Multiplanar CT image reconstructions of the cervical spine were also generated. COMPARISON:  None. FINDINGS: CT HEAD FINDINGS Brain: Scattered traumatic appearing subarachnoid blood along the cerebral and cerebellar convexities. Hemorrhagic contusion in the inferior right frontal lobe involving a maximal 3 cm diameter area. Smaller hemorrhagic contusion in the left occipital pole and left cerebellum. Thin subdural hematoma along the left cerebral convexity, left tentorium and anterior falx. The left frontal subdural measures up to 4 mm in thickness. No visible infarct. No hydrocephalus Vascular: No hyperdense vessel or unexpected calcification. Skull: Nondepressed linear skull fracture through the left paramedian occipital bone. There is a separate limited fracture through the posterior foramen magnum. Overlying scalp contusion. Sinuses/Orbits: No evidence of injury. Other: Incidental parotid calcifications CT CERVICAL SPINE FINDINGS Alignment: Normal. Skull base and vertebrae: Negative for fracture Soft tissues and spinal canal: No prevertebral fluid or swelling. No visible canal hematoma. Disc levels: C5-6 and C6-7 degenerative disc narrowing and spurring. Asymmetric left facet degeneration and spurring with C3-4 ankylosis. Upper chest: Negative Critical Value/emergent results were called by telephone at the time of interpretation on 10/07/2017 at 4:57 pm to Dr. Willette Alma , who verbally acknowledged these results. IMPRESSION: 1. Right inferior frontal, left occipital pole, and left cerebellar hemorrhagic contusions. 2. Patchy traumatic pattern subarachnoid hemorrhage. 3. Thin subdural hematomas along the falx, left tentorium, and left  cerebral convexity. 4. Nondepressed occipital bone fractures. 5. Negative for cervical spine fracture. Electronically Signed   By: Monte Fantasia M.D.   On: 10/07/2017 17:00   Ct Cervical Spine Wo Contrast  Result Date: 10/07/2017 CLINICAL DATA:  Fall with head injury. Initial encounter. EXAM: CT HEAD WITHOUT CONTRAST CT CERVICAL SPINE WITHOUT CONTRAST TECHNIQUE: Multidetector CT imaging of the head and cervical spine was performed following the standard protocol without intravenous contrast. Multiplanar CT image reconstructions of the cervical spine were also generated. COMPARISON:  None. FINDINGS: CT HEAD FINDINGS Brain: Scattered traumatic appearing subarachnoid blood along the cerebral and cerebellar convexities. Hemorrhagic contusion in the inferior right frontal lobe involving a maximal 3 cm diameter area. Smaller hemorrhagic contusion in the left occipital pole and left cerebellum. Thin subdural hematoma along the left cerebral convexity, left tentorium and anterior falx. The left frontal subdural measures up to 4 mm in thickness. No visible infarct. No hydrocephalus Vascular: No hyperdense vessel or unexpected calcification. Skull: Nondepressed linear skull fracture through the left paramedian occipital bone. There is a separate limited fracture through the posterior foramen magnum. Overlying scalp contusion. Sinuses/Orbits: No evidence of injury. Other: Incidental parotid calcifications  CT CERVICAL SPINE FINDINGS Alignment: Normal. Skull base and vertebrae: Negative for fracture Soft tissues and spinal canal: No prevertebral fluid or swelling. No visible canal hematoma. Disc levels: C5-6 and C6-7 degenerative disc narrowing and spurring. Asymmetric left facet degeneration and spurring with C3-4 ankylosis. Upper chest: Negative Critical Value/emergent results were called by telephone at the time of interpretation on 10/07/2017 at 4:57 pm to Dr. Willette Alma , who verbally acknowledged these results.  IMPRESSION: 1. Right inferior frontal, left occipital pole, and left cerebellar hemorrhagic contusions. 2. Patchy traumatic pattern subarachnoid hemorrhage. 3. Thin subdural hematomas along the falx, left tentorium, and left cerebral convexity. 4. Nondepressed occipital bone fractures. 5. Negative for cervical spine fracture. Electronically Signed   By: Monte Fantasia M.D.   On: 10/07/2017 17:00     Assessment/Plan: 77 year old presents to the ED today after a fall, striking her head on the concrete. Patients husband did witness the fall. Difficult to examine patient given her confusion and lethargy. Agree with CT report. No nsgy intervention at this time. Will admit to ICU and get a follow up CT scan of her head in the morning.    Ocie Cornfield [facility] 10/07/2017 6:22 PM

## 2017-10-08 ENCOUNTER — Inpatient Hospital Stay (HOSPITAL_COMMUNITY): Payer: Medicare HMO

## 2017-10-08 LAB — MRSA PCR SCREENING: MRSA by PCR: NEGATIVE

## 2017-10-08 LAB — SURGICAL PCR SCREEN
MRSA, PCR: NEGATIVE
Staphylococcus aureus: NEGATIVE

## 2017-10-08 MED ORDER — ACETAMINOPHEN 650 MG RE SUPP
650.0000 mg | Freq: Four times a day (QID) | RECTAL | Status: DC | PRN
  Administered 2017-10-08: 650 mg via RECTAL
  Filled 2017-10-08: qty 1

## 2017-10-08 MED ORDER — HALOPERIDOL LACTATE 5 MG/ML IJ SOLN
2.5000 mg | Freq: Once | INTRAMUSCULAR | Status: AC
  Administered 2017-10-08: 2.5 mg via INTRAVENOUS

## 2017-10-08 MED ORDER — HALOPERIDOL LACTATE 5 MG/ML IJ SOLN
INTRAMUSCULAR | Status: AC
  Filled 2017-10-08: qty 1

## 2017-10-08 NOTE — Evaluation (Signed)
Clinical/Bedside Swallow Evaluation Patient Details  Name: Sharon Zavala MRN: [id number] Date of Birth: 23-Feb-1941  Today's Date: 10/08/2017 Time: SLP Start Time (ACUTE ONLY): 1448 SLP Stop Time (ACUTE ONLY): 1455 SLP Time Calculation (min) (ACUTE ONLY): 7 min  Past Medical History:  Past Medical History:  Diagnosis Date  . 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 Routine general medical examination at a health care facility   . Unspecified essential hypertension    Past Surgical History:  Past Surgical History:  Procedure Laterality Date  . MASTECTOMY MODIFIED RADICAL  '89 & '97   Bilateral  . TONSILLECTOMY     HPI:  77 year old patient presented 10/07/17 after a fall in her garage. Husband states that she tripped on the steps and fell backwards on her head. He reports that she has been confused and sleepy since about 2:00pm when the incident happened. She does not have any significant medical history other than breast cancer. CT 10/08/17 showed increased size of right frontal greater than left occipital and cerebellar hemorrhagic contusions and associated mild edema, unchanged scattered subarachnoid hemorrhage, subdural hematomas over the left cerebral convexity and along the falx. Unchanged thin left tentorial subdural hematoma.   Assessment / Plan / Recommendation Clinical Impression  Patient's oropharyngeal swallow appears within functional limits with adequate airway protection in this limited assessment. She tolerates single sips of thin liquids, 2 bites of puree without overt signs of aspiration. No focal CN impairments apparent, however pt's cooperative with exam is limited. She is confused, impulsive, and distractible, making repeated attempts to get out of bed and perseverating on removal of her restraints. Alertness fluctuating; pt makes a request of SLP and falls asleep almost immediately, requiring re-arousal. Greatest risk for aspiration is mentation; she  is not yet ready for diet;  recommend sips of water, meds whole in puree when she is fully alert, with full supervision. Will follow up next date.    SLP Visit Diagnosis: Dysphagia, unspecified (R13.10)    Aspiration Risk  Moderate aspiration risk    Diet Recommendation NPO except meds;Free water protocol after oral care;Ice chips PRN after oral care   Medication Administration: Whole meds with puree Supervision: Full supervision/cueing for compensatory strategies Compensations: Minimize environmental distractions;Slow rate;Small sips/bites Postural Changes: Seated upright at 90 degrees    Other  Recommendations Oral Care Recommendations: Oral care QID;Oral care prior to ice chip/H20   Follow up Recommendations Other (comment)(tbd)      Frequency and Duration min 2x/week  2 weeks       Prognosis Prognosis for Safe Diet Advancement: Good Barriers to Reach Goals: Cognitive deficits;Behavior      Swallow Study   General Date of Onset: 10/07/17 HPI: 77 year old patient presented 10/07/17 after a fall in her garage. Husband states that she tripped on the steps and fell backwards on her head. He reports that she has been confused and sleepy since about 2:00pm when the incident happened. She does not have any significant medical history other than breast cancer. CT 10/08/17 showed increased size of right frontal greater than left occipital and cerebellar hemorrhagic contusions and associated mild edema, unchanged scattered subarachnoid hemorrhage, subdural hematomas over the left cerebral convexity and along the falx. Unchanged thin left tentorial subdural hematoma. Type of Study: Bedside Swallow Evaluation Previous Swallow Assessment: none on file Diet Prior to this Study: NPO Temperature Spikes Noted: No Respiratory Status: Room air History of Recent Intubation: No Behavior/Cognition: Alert;Confused;Agitated;Impulsive;Distractible;Uncooperative Oral Cavity Assessment: Within  Functional  Limits Oral Care Completed by SLP: No Oral Cavity - Dentition: Other (Comment)(appears to be natural dentition; pt uncooperative with exam) Vision: Functional for self-feeding Self-Feeding Abilities: Total assist Patient Positioning: Upright in bed Baseline Vocal Quality: Normal Volitional Cough: Cognitively unable to elicit Volitional Swallow: Unable to elicit    Oral/Motor/Sensory Function Overall Oral Motor/Sensory Function: Other (comment)(appears WFL; pt not cooperative with exam)   Ice Chips Ice chips: Within functional limits Presentation: Spoon   Thin Liquid Thin Liquid: Within functional limits Presentation: Cup;Straw    Nectar Thick Nectar Thick Liquid: Not tested   Honey Thick Honey Thick Liquid: Not tested   Puree Puree: Within functional limits Presentation: [place], [place], CCC-SLP Speech-Language Pathologist [phone] Solid: Not tested        Sharon Zavala 10/08/2017,2:01 PM

## 2017-10-08 NOTE — Progress Notes (Signed)
Pt is extremely agitated and confused off and on.  She is also aphasic so reorientation has been extremely difficult. After multiple attempts to redirect her attention, cover up the IV site and change the BP location, mittens and restraints were required to keep her from interfering with our care.  Pt is constantly fighting the restraints and has managed to get out of them three different times as of right now. Will continue to monitor and contact on call neurosurgeon if the pt doesn't calm down.

## 2017-10-08 NOTE — Progress Notes (Signed)
Patient ID: Sharon Zavala, female   DOB: Feb 14, 1941, 77 y.o.   MRN: [id number] Subjective: Patient remains agitated, states name, c/o headache  Objective: Vital signs in last 24 hours: Temp:  [98.1 F (36.7 C)-100.3 F (37.9 C)] 98.7 F (37.1 C) (03/09 0624) Pulse Rate:  [46-112] 95 (03/09 0700) Resp:  [8-32] 19 (03/09 0700) BP: (107-175)/(60-151) 137/75 (03/09 0700) SpO2:  [70 %-100 %] 99 % (03/09 0700) Weight:  [74.5 kg (164 lb 3.9 oz)-74.8 kg (165 lb)] 74.5 kg (164 lb 3.9 oz) (03/08 2000)  Intake/Output from previous day: 03/08 0701 - 03/09 0700 In: 768.8 [I.V.:768.8] Out: 475 [Urine:475] Intake/Output this shift: No intake/output data recorded.  awake, FC, MAEx4, states name  Lab Results: Lab Results  Component Value Date   WBC 14.2 (H) 10/07/2017   HGB 14.2 10/07/2017   HGB 14.6 10/07/2017   HCT 41.4 10/07/2017   HCT 43.0 10/07/2017   MCV 92.2 10/07/2017   PLT 304 10/07/2017   No results found for: INR, PROTIME BMET Lab Results  Component Value Date   NA 140 10/07/2017   K 3.0 (L) 10/07/2017   CL 101 10/07/2017   CO2 31 10/12/2016   GLUCOSE 128 (H) 10/07/2017   BUN 15 10/07/2017   CREATININE 0.70 10/07/2017   CALCIUM 10.2 10/12/2016    Studies/Results: Ct Head Wo Contrast  Result Date: 10/08/2017 CLINICAL DATA:  Altered level of consciousness. Fall with head injury and intracranial hemorrhage. EXAM: CT HEAD WITHOUT CONTRAST TECHNIQUE: Contiguous axial images were obtained from the base of the skull through the vertex without intravenous contrast. COMPARISON:  10/07/2017 FINDINGS: Brain: Thin subdural hematoma over the left cerebral convexity measures 2 mm in thickness (previously 4 mm). Thin subdural hematoma anteriorly along the falx also appears minimally diminished. Thin subdural hematoma along the left tentorium is unchanged. Small hemorrhagic contusions are again seen in the left cerebellum and left occipital lobe with minimally increased edema but no  significant mass effect. Hemorrhagic contusion in the inferior right frontal lobe has enlarged, now measuring 4.6 cm. There is mild surrounding edema which has increased. Scattered subarachnoid hemorrhage over the cerebral and cerebellar convexities does not appear significantly changed in overall volume. There is no midline shift, acute large territory infarct, or evidence of hydrocephalus. The basilar cisterns are patent. Vascular: No hyperdense vessel. Skull: Unchanged nondepressed left occipital skull fractures. Sinuses/Orbits: Small volume fluid and mucosal thickening in a few right ethmoid air cells. Clear mastoid air cells. Unremarkable orbits. Other: Similar mild posterior scalp soft tissue swelling/contusion. IMPRESSION: 1. Increased size of right frontal greater than left occipital and cerebellar hemorrhagic contusions and associated mild edema. 2. Unchanged scattered subarachnoid hemorrhage. 3. Slightly decreased thickness of thin subdural hematomas over the left cerebral convexity and along the falx. Unchanged thin left tentorial subdural hematoma. Electronically Signed   By: Logan Bores M.D.   On: 10/08/2017 06:51   Ct Head Wo Contrast  Result Date: 10/07/2017 CLINICAL DATA:  Fall with head injury. Initial encounter. EXAM: CT HEAD WITHOUT CONTRAST CT CERVICAL SPINE WITHOUT CONTRAST TECHNIQUE: Multidetector CT imaging of the head and cervical spine was performed following the standard protocol without intravenous contrast. Multiplanar CT image reconstructions of the cervical spine were also generated. COMPARISON:  None. FINDINGS: CT HEAD FINDINGS Brain: Scattered traumatic appearing subarachnoid blood along the cerebral and cerebellar convexities. Hemorrhagic contusion in the inferior right frontal lobe involving a maximal 3 cm diameter area. Smaller hemorrhagic contusion in the left occipital pole and left cerebellum. Thin  subdural hematoma along the left cerebral convexity, left tentorium and  anterior falx. The left frontal subdural measures up to 4 mm in thickness. No visible infarct. No hydrocephalus Vascular: No hyperdense vessel or unexpected calcification. Skull: Nondepressed linear skull fracture through the left paramedian occipital bone. There is a separate limited fracture through the posterior foramen magnum. Overlying scalp contusion. Sinuses/Orbits: No evidence of injury. Other: Incidental parotid calcifications CT CERVICAL SPINE FINDINGS Alignment: Normal. Skull base and vertebrae: Negative for fracture Soft tissues and spinal canal: No prevertebral fluid or swelling. No visible canal hematoma. Disc levels: C5-6 and C6-7 degenerative disc narrowing and spurring. Asymmetric left facet degeneration and spurring with C3-4 ankylosis. Upper chest: Negative Critical Value/emergent results were called by telephone at the time of interpretation on 10/07/2017 at 4:57 pm to Dr. Willette Alma , who verbally acknowledged these results. IMPRESSION: 1. Right inferior frontal, left occipital pole, and left cerebellar hemorrhagic contusions. 2. Patchy traumatic pattern subarachnoid hemorrhage. 3. Thin subdural hematomas along the falx, left tentorium, and left cerebral convexity. 4. Nondepressed occipital bone fractures. 5. Negative for cervical spine fracture. Electronically Signed   By: Monte Fantasia M.D.   On: 10/07/2017 17:00   Ct Cervical Spine Wo Contrast  Result Date: 10/07/2017 CLINICAL DATA:  Fall with head injury. Initial encounter. EXAM: CT HEAD WITHOUT CONTRAST CT CERVICAL SPINE WITHOUT CONTRAST TECHNIQUE: Multidetector CT imaging of the head and cervical spine was performed following the standard protocol without intravenous contrast. Multiplanar CT image reconstructions of the cervical spine were also generated. COMPARISON:  None. FINDINGS: CT HEAD FINDINGS Brain: Scattered traumatic appearing subarachnoid blood along the cerebral and cerebellar convexities. Hemorrhagic contusion in the  inferior right frontal lobe involving a maximal 3 cm diameter area. Smaller hemorrhagic contusion in the left occipital pole and left cerebellum. Thin subdural hematoma along the left cerebral convexity, left tentorium and anterior falx. The left frontal subdural measures up to 4 mm in thickness. No visible infarct. No hydrocephalus Vascular: No hyperdense vessel or unexpected calcification. Skull: Nondepressed linear skull fracture through the left paramedian occipital bone. There is a separate limited fracture through the posterior foramen magnum. Overlying scalp contusion. Sinuses/Orbits: No evidence of injury. Other: Incidental parotid calcifications CT CERVICAL SPINE FINDINGS Alignment: Normal. Skull base and vertebrae: Negative for fracture Soft tissues and spinal canal: No prevertebral fluid or swelling. No visible canal hematoma. Disc levels: C5-6 and C6-7 degenerative disc narrowing and spurring. Asymmetric left facet degeneration and spurring with C3-4 ankylosis. Upper chest: Negative Critical Value/emergent results were called by telephone at the time of interpretation on 10/07/2017 at 4:57 pm to Dr. Willette Alma , who verbally acknowledged these results. IMPRESSION: 1. Right inferior frontal, left occipital pole, and left cerebellar hemorrhagic contusions. 2. Patchy traumatic pattern subarachnoid hemorrhage. 3. Thin subdural hematomas along the falx, left tentorium, and left cerebral convexity. 4. Nondepressed occipital bone fractures. 5. Negative for cervical spine fracture. Electronically Signed   By: Monte Fantasia M.D.   On: 10/07/2017 17:00    Assessment/Plan: Overall stable, CT as expected, no surgery indicated  Estimated body mass index is 28.19 kg/m as calculated from the following:   Height as of this encounter: 5\' 4"  (1.626 m).   Weight as of this encounter: 74.5 kg (164 lb 3.9 oz).    LOS: 1 day    Schae Cando S 10/08/2017, 8:42 AM

## 2017-10-09 ENCOUNTER — Inpatient Hospital Stay (HOSPITAL_COMMUNITY): Payer: Medicare HMO

## 2017-10-09 MED ORDER — HYDROCODONE-ACETAMINOPHEN 5-325 MG PO TABS
1.0000 | ORAL_TABLET | Freq: Four times a day (QID) | ORAL | Status: DC | PRN
  Administered 2017-10-09 – 2017-10-11 (×5): 1 via ORAL
  Filled 2017-10-09 (×5): qty 1

## 2017-10-09 NOTE — Progress Notes (Signed)
Pt is much more calm and cooperative tonight compared to last night.  She is still having some off and on moments of confusion where redirection is necessary, but overall the aphasia and confusion appear to have drastically improved. Will continue to monitor.

## 2017-10-09 NOTE — Progress Notes (Signed)
  Speech Language Pathology Treatment: Dysphagia  Patient Details Name: Sharon Zavala MRN: [id number] DOB: Sep 21, 1940 Today's Date: 10/09/2017 Time: 0850-0905 SLP Time Calculation (min) (ACUTE ONLY): 15 min  Assessment / Plan / Recommendation Clinical Impression  Pt calm and cooperative for session; oriented x4 and following all commands. Affect is slightly flat and pt reports she feels "cloudy." Provided upgraded PO trials of thin liquids, pureed and regular solids for reassessment; pt demonstrates swallow function which appears to be within normal limits with adequate airway protection. She is able to self-feed independently. Recommend regular diet with thin liquids, meds whole with liquid. Will f/u briefly for tolerance. Recommend SLP cognitive-linguistic assessment.     HPI HPI: 77 year old patient presented 10/07/17 after a fall in her garage. Husband states that she tripped on the steps and fell backwards on her head. He reports that she has been confused and sleepy since about 2:00pm when the incident happened. She does not have any significant medical history other than breast cancer. CT 10/08/17 showed increased size of right frontal greater than left occipital and cerebellar hemorrhagic contusions and associated mild edema, unchanged scattered subarachnoid hemorrhage, subdural hematomas over the left cerebral convexity and along the falx. Unchanged thin left tentorial subdural hematoma.      SLP Plan  Continue with current plan of care       Recommendations  Diet recommendations: Regular;Thin liquid Liquids provided via: Cup;Straw Medication Administration: Whole meds with liquid Supervision: Patient able to self feed                Oral Care Recommendations: Oral care BID Follow up Recommendations: Other (comment)(none for swallow) SLP Visit Diagnosis: Dysphagia, unspecified (R13.10) Plan: Continue with current plan of care       [place], [place], [place] Speech-Language Pathologist [place] 10/09/2017, 9:10 AM

## 2017-10-09 NOTE — Progress Notes (Signed)
Patient ID: Sharon Zavala, female   DOB: 02-17-1941, 77 y.o.   MRN: [id number] Subjective: Patient reports some headache but overall she is much improved  Objective: Vital signs in last 24 hours: Temp:  [98.9 F (37.2 C)-101.1 F (38.4 C)] 99.7 F (37.6 C) (03/10 0756) Pulse Rate:  [68-92] 74 (03/10 0800) Resp:  [12-24] 19 (03/10 0800) BP: (108-155)/(41-111) 136/68 (03/10 0800) SpO2:  [91 %-100 %] 93 % (03/10 0800)  Intake/Output from previous day: 03/09 0701 - 03/10 0700 In: 1725 [I.V.:1725] Out: 1375 [Urine:1375] Intake/Output this shift: Total I/O In: 75 [I.V.:75] Out: 100 [Urine:100]  Neurologic: Grossly normal, awake and alert and following commands and moving all extremities  Lab Results: Lab Results  Component Value Date   WBC 14.2 (H) 10/07/2017   HGB 14.2 10/07/2017   HGB 14.6 10/07/2017   HCT 41.4 10/07/2017   HCT 43.0 10/07/2017   MCV 92.2 10/07/2017   PLT 304 10/07/2017   No results found for: INR, PROTIME BMET Lab Results  Component Value Date   NA 140 10/07/2017   K 3.0 (L) 10/07/2017   CL 101 10/07/2017   CO2 31 10/12/2016   GLUCOSE 128 (H) 10/07/2017   BUN 15 10/07/2017   CREATININE 0.70 10/07/2017   CALCIUM 10.2 10/12/2016    Studies/Results: Ct Head Wo Contrast  Result Date: 10/08/2017 CLINICAL DATA:  Altered level of consciousness. Fall with head injury and intracranial hemorrhage. EXAM: CT HEAD WITHOUT CONTRAST TECHNIQUE: Contiguous axial images were obtained from the base of the skull through the vertex without intravenous contrast. COMPARISON:  10/07/2017 FINDINGS: Brain: Thin subdural hematoma over the left cerebral convexity measures 2 mm in thickness (previously 4 mm). Thin subdural hematoma anteriorly along the falx also appears minimally diminished. Thin subdural hematoma along the left tentorium is unchanged. Small hemorrhagic contusions are again seen in the left cerebellum and left occipital lobe with minimally increased edema but  no significant mass effect. Hemorrhagic contusion in the inferior right frontal lobe has enlarged, now measuring 4.6 cm. There is mild surrounding edema which has increased. Scattered subarachnoid hemorrhage over the cerebral and cerebellar convexities does not appear significantly changed in overall volume. There is no midline shift, acute large territory infarct, or evidence of hydrocephalus. The basilar cisterns are patent. Vascular: No hyperdense vessel. Skull: Unchanged nondepressed left occipital skull fractures. Sinuses/Orbits: Small volume fluid and mucosal thickening in a few right ethmoid air cells. Clear mastoid air cells. Unremarkable orbits. Other: Similar mild posterior scalp soft tissue swelling/contusion. IMPRESSION: 1. Increased size of right frontal greater than left occipital and cerebellar hemorrhagic contusions and associated mild edema. 2. Unchanged scattered subarachnoid hemorrhage. 3. Slightly decreased thickness of thin subdural hematomas over the left cerebral convexity and along the falx. Unchanged thin left tentorial subdural hematoma. Electronically Signed   By: Logan Bores M.D.   On: 10/08/2017 06:51   Ct Head Wo Contrast  Result Date: 10/07/2017 CLINICAL DATA:  Fall with head injury. Initial encounter. EXAM: CT HEAD WITHOUT CONTRAST CT CERVICAL SPINE WITHOUT CONTRAST TECHNIQUE: Multidetector CT imaging of the head and cervical spine was performed following the standard protocol without intravenous contrast. Multiplanar CT image reconstructions of the cervical spine were also generated. COMPARISON:  None. FINDINGS: CT HEAD FINDINGS Brain: Scattered traumatic appearing subarachnoid blood along the cerebral and cerebellar convexities. Hemorrhagic contusion in the inferior right frontal lobe involving a maximal 3 cm diameter area. Smaller hemorrhagic contusion in the left occipital pole and left cerebellum. Thin subdural hematoma along the  left cerebral convexity, left tentorium and  anterior falx. The left frontal subdural measures up to 4 mm in thickness. No visible infarct. No hydrocephalus Vascular: No hyperdense vessel or unexpected calcification. Skull: Nondepressed linear skull fracture through the left paramedian occipital bone. There is a separate limited fracture through the posterior foramen magnum. Overlying scalp contusion. Sinuses/Orbits: No evidence of injury. Other: Incidental parotid calcifications CT CERVICAL SPINE FINDINGS Alignment: Normal. Skull base and vertebrae: Negative for fracture Soft tissues and spinal canal: No prevertebral fluid or swelling. No visible canal hematoma. Disc levels: C5-6 and C6-7 degenerative disc narrowing and spurring. Asymmetric left facet degeneration and spurring with C3-4 ankylosis. Upper chest: Negative Critical Value/emergent results were called by telephone at the time of interpretation on 10/07/2017 at 4:57 pm to Dr. Willette Alma , who verbally acknowledged these results. IMPRESSION: 1. Right inferior frontal, left occipital pole, and left cerebellar hemorrhagic contusions. 2. Patchy traumatic pattern subarachnoid hemorrhage. 3. Thin subdural hematomas along the falx, left tentorium, and left cerebral convexity. 4. Nondepressed occipital bone fractures. 5. Negative for cervical spine fracture. Electronically Signed   By: Monte Fantasia M.D.   On: 10/07/2017 17:00   Ct Cervical Spine Wo Contrast  Result Date: 10/07/2017 CLINICAL DATA:  Fall with head injury. Initial encounter. EXAM: CT HEAD WITHOUT CONTRAST CT CERVICAL SPINE WITHOUT CONTRAST TECHNIQUE: Multidetector CT imaging of the head and cervical spine was performed following the standard protocol without intravenous contrast. Multiplanar CT image reconstructions of the cervical spine were also generated. COMPARISON:  None. FINDINGS: CT HEAD FINDINGS Brain: Scattered traumatic appearing subarachnoid blood along the cerebral and cerebellar convexities. Hemorrhagic contusion in the  inferior right frontal lobe involving a maximal 3 cm diameter area. Smaller hemorrhagic contusion in the left occipital pole and left cerebellum. Thin subdural hematoma along the left cerebral convexity, left tentorium and anterior falx. The left frontal subdural measures up to 4 mm in thickness. No visible infarct. No hydrocephalus Vascular: No hyperdense vessel or unexpected calcification. Skull: Nondepressed linear skull fracture through the left paramedian occipital bone. There is a separate limited fracture through the posterior foramen magnum. Overlying scalp contusion. Sinuses/Orbits: No evidence of injury. Other: Incidental parotid calcifications CT CERVICAL SPINE FINDINGS Alignment: Normal. Skull base and vertebrae: Negative for fracture Soft tissues and spinal canal: No prevertebral fluid or swelling. No visible canal hematoma. Disc levels: C5-6 and C6-7 degenerative disc narrowing and spurring. Asymmetric left facet degeneration and spurring with C3-4 ankylosis. Upper chest: Negative Critical Value/emergent results were called by telephone at the time of interpretation on 10/07/2017 at 4:57 pm to Dr. Willette Alma , who verbally acknowledged these results. IMPRESSION: 1. Right inferior frontal, left occipital pole, and left cerebellar hemorrhagic contusions. 2. Patchy traumatic pattern subarachnoid hemorrhage. 3. Thin subdural hematomas along the falx, left tentorium, and left cerebral convexity. 4. Nondepressed occipital bone fractures. 5. Negative for cervical spine fracture. Electronically Signed   By: Monte Fantasia M.D.   On: 10/07/2017 17:00    Assessment/Plan: Much better today, transfer to floor today, PTOT  Estimated body mass index is 28.19 kg/m as calculated from the following:   Height as of this encounter: 5\' 4"  (1.626 m).   Weight as of this encounter: 74.5 kg (164 lb 3.9 oz).    LOS: 2 days    Kycen Spalla S 10/09/2017, 9:22 AM

## 2017-10-10 MED ORDER — POTASSIUM CHLORIDE IN NACL 20-0.9 MEQ/L-% IV SOLN
INTRAVENOUS | Status: DC
Start: 2017-10-10 — End: 2017-10-11

## 2017-10-10 NOTE — Progress Notes (Signed)
Patient ID: Sharon Zavala, female   DOB: [birth year], 77 y.o.   MRN: [id number] Subjective: Patient reports no headache, just tired  Objective: Vital signs in last 24 hours: Temp:  [98.2 F (36.8 C)-99.7 F (37.6 C)] 99.6 F (37.6 C) (03/11 0327) Pulse Rate:  [65-87] 70 (03/11 0700) Resp:  [10-23] 16 (03/11 0700) BP: (125-156)/(59-84) 147/73 (03/11 0700) SpO2:  [87 %-98 %] 98 % (03/11 0700)  Intake/Output from previous day: 03/10 0701 - 03/11 0700 In: 525 [I.V.:525] Out: 100 [Urine:100] Intake/Output this shift: No intake/output data recorded.  Neurologic: Grossly normal, OOB to chair, appropriate  Lab Results: Lab Results  Component Value Date   WBC 14.2 (H) 10/07/2017   HGB 14.2 10/07/2017   HGB 14.6 10/07/2017   HCT 41.4 10/07/2017   HCT 43.0 10/07/2017   MCV 92.2 10/07/2017   PLT 304 10/07/2017   No results found for: INR, PROTIME BMET Lab Results  Component Value Date   NA 140 10/07/2017   K 3.0 (L) 10/07/2017   CL 101 10/07/2017   CO2 31 10/12/2016   GLUCOSE 128 (H) 10/07/2017   BUN 15 10/07/2017   CREATININE 0.70 10/07/2017   CALCIUM 10.2 10/12/2016    Studies/Results: Dg Lumbar Spine Complete  Result Date: 10/09/2017 CLINICAL DATA:  Low back pain, fall couple of days ago. EXAM: LUMBAR SPINE - COMPLETE 4+ VIEW COMPARISON:  None. 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 No fracture line or displaced fracture fragment identified. No compression fracture deformity. Degenerative changes are seen throughout the lumbar spine, at least moderate in degree, with associated disc space narrowing and osseous spurring. Degenerative facet hypertrophy is also present from L2 through S1 with likely some degree of associated osseous neural foramen encroachment. Upper sacrum appears intact and normally aligned. Visualized paravertebral soft tissues are unremarkable. IMPRESSION: 1. Degenerative changes, at least moderate in degree, as  detailed above. If any radiculopathic symptoms, would consider nonemergent lumbar spine MRI to exclude associated nerve root impingement. 2. Scoliosis. 3. No acute findings. Electronically Signed   By: Franki Cabot M.D.   On: 10/09/2017 13:41    Assessment/Plan: Looks great today, PT/OT, if does well home tomorrow  Estimated body mass index is 28.19 kg/m as calculated from the following:   Height as of this encounter: 5\' 4"  (1.626 m).   Weight as of this encounter: 74.5 kg (164 lb 3.9 oz).    LOS: 3 days    Kieffer Blatz S 10/10/2017, 7:55 AM

## 2017-10-10 NOTE — Evaluation (Signed)
Occupational Therapy Evaluation Patient Details Name: Sharon Zavala MRN: [id number] DOB: Aug 06, 1940 Today's Date: 10/10/2017    History of Present Illness 77 yo admitted after fall landing on her head with decreased mental status. Pt with scattered SAH, frontal and occipital SDH. PMhx: breast CA   Clinical Impression   PTA, pt was living with her husband and was independent. Currently, pt requires Min Guard for ADLs and functional mobility due to decreased balance. Pt denies any vision deficits. Provided education on LB ADLs, toilet transfer, and shower transfer with 3N1; pt demonstrated understanding. Pt would benefit from further acute OT to facilitate safe dc and address fall prevention during ADLs/IADLs. Recommend dc home once medically stable per physician.      Follow Up Recommendations  No OT follow up;Supervision/Assistance - 24 hour    Equipment Recommendations  3 in 1 bedside commode    Recommendations for Other Services PT consult     Precautions / Restrictions Precautions Precautions: Fall Restrictions Weight Bearing Restrictions: No      Mobility Bed Mobility Overal bed mobility: Modified Independent                Transfers Overall transfer level: Needs assistance Equipment used: None Transfers: Sit to/from Stand Sit to Stand: Min guard         General transfer comment: Min Guard for safety    Balance Overall balance assessment: Needs assistance                                ADL either performed or assessed with clinical judgement   ADL Overall ADL's : Needs assistance/impaired Eating/Feeding: Set up;Sitting   Grooming: Min guard;Standing   Upper Body Bathing: Set up;Sitting   Lower Body Bathing: Min guard;Sit to/from stand   Upper Body Dressing : Set up;Sitting   Lower Body Dressing: Min guard;Sit to/from stand Lower Body Dressing Details (indicate cue type and reason): Pt able to adjust socks at EOB without  difficulty. Educating pt on safe techniques for donning underwear and pants when at home to decreased fall risk.         Tub/ Shower Transfer: Walk-in shower;Min guard;Ambulation Tub/Shower Transfer Details (indicate cue type and reason): Educating pt on safe shower transfer and for fall prevention. Pt demosntrating understanding with Mi nguard A for safety. Educating pt on use of 3N1 as shower seat Functional mobility during ADLs: Min guard General ADL Comments: Pt performing ADLs and functional mobility at [facility] level for safety due to decreased balance. Educated pt on safety during ADLs at home.      Vision Patient Visual Report: No change from baseline Additional Comments: No noted vision deficits during ADLs     Perception     Praxis      Pertinent Vitals/Pain Pain Assessment: No/denies pain Pain Descriptors / Indicators: (pinching)     Hand Dominance Right   Extremity/Trunk Assessment Upper Extremity Assessment Upper Extremity Assessment: Overall WFL for tasks assessed   Lower Extremity Assessment Lower Extremity Assessment: Overall WFL for tasks assessed   Cervical / Trunk Assessment Cervical / Trunk Assessment: Normal   Communication Communication Communication: No difficulties   Cognition Arousal/Alertness: Awake/alert Behavior During Therapy: WFL for tasks assessed/performed;Flat affect Overall Cognitive Status: Within Functional Limits for tasks assessed  General Comments: WFL for tasks completed   General Comments  Husband present throughout session. Answering questions about OP therapy set up and services    Exercises     Shoulder Instructions      Home Living Family/patient expects to be discharged to:: Private residence Living Arrangements: Spouse/significant other Available Help at Discharge: Family;Available 24 hours/day Type of Home: House Home Access: Stairs to enter [employer]  of Steps: 3 Entrance Stairs-Rails: None Home Layout: Two level;Bed/bath upstairs Alternate Level Stairs-Number of Steps: 14 Alternate Level Stairs-Rails: Left Bathroom Shower/Tub: Occupational psychologist: Handicapped height     Home Equipment: Environmental consultant - 2 wheels;Cane - single point;Wheelchair - manual   Additional Comments: equipment in attic somewhere that belonged to pt's mother      Prior Functioning/Environment Level of Independence: Independent                 OT Problem List: Impaired balance (sitting and/or standing);Decreased safety awareness;Decreased knowledge of use of DME or AE;Decreased knowledge of precautions;Decreased activity tolerance      OT Treatment/Interventions: Self-care/ADL training;Therapeutic exercise;Energy conservation;DME and/or AE instruction;Therapeutic activities;Patient/family education;Balance training    OT Goals(Current goals can be found in the care plan section) Acute Rehab OT Goals Patient Stated Goal: Return to cooking OT Goal Formulation: With patient Time For Goal Achievement: 10/24/17 Potential to Achieve Goals: Good ADL Goals Additional ADL Goal #1: Pt will performing ADLs at Mod I level with increased time Additional ADL Goal #2: Pt and husband will verbalize three fall prevention techniques independently  OT Frequency: Min 2X/week   Barriers to D/C:            Co-evaluation              AM-PAC PT "6 Clicks" Daily Activity     Outcome Measure Help from another person eating meals?: None Help from another person taking care of personal grooming?: A Little Help from another person toileting, which includes using toliet, bedpan, or urinal?: A Little Help from another person bathing (including washing, rinsing, drying)?: A Little Help from another person to put on and taking off regular upper body clothing?: None Help from another person to put on and taking off regular lower body clothing?: A Little 6 Click  Score: 20   End of Session Equipment Utilized During Treatment: Gait belt Nurse Communication: Mobility status  Activity Tolerance: Patient tolerated treatment well Patient left: in bed;with call bell/phone within reach;with family/visitor present  OT Visit Diagnosis: Unsteadiness on feet (R26.81);Other abnormalities of gait and mobility (R26.89);Muscle weakness (generalized) (M62.81)                Time: 2202-5427 OT Time Calculation (min): 18 min Charges:  OT General Charges $OT Visit: 1 Visit OT Evaluation $OT Eval Moderate Complexity: 1 Mod G-Codes:     [place] MSOT, OTR/L Acute Rehab Pager: [phone] Office: [place] 10/10/2017, 2:27 PM

## 2017-10-10 NOTE — Progress Notes (Signed)
  Speech Language Pathology Treatment: Dysphagia  Patient Details Name: Sharon Zavala MRN: [id number] DOB: August 17, 1940 Today's Date: 10/10/2017 Time: 1222-1230 SLP Time Calculation (min) (ACUTE ONLY): 8 min  Assessment / Plan / Recommendation Clinical Impression  Pt demonstrates adequate tolerance of thin liquids, reports no difficulty with meals though appetite is poor. No further SLP intervention needed for swallowing, will sign off.   HPI HPI: 77 year old patient presented 10/07/17 after a fall in her garage. Husband states that she tripped on the steps and fell backwards on her head. He reports that she has been confused and sleepy since about 2:00pm when the incident happened. She does not have any significant medical history other than breast cancer. CT 10/08/17 showed increased size of right frontal greater than left occipital and cerebellar hemorrhagic contusions and associated mild edema, unchanged scattered subarachnoid hemorrhage, subdural hematomas over the left cerebral convexity and along the falx. Unchanged thin left tentorial subdural hematoma.      SLP Plan  All goals met  Patient does not need any further Speech Lanaguage Pathology Services    Recommendations  Diet recommendations: Regular;Thin liquid Liquids provided via: Cup;Straw Medication Administration: Whole meds with liquid Supervision: Patient able to self feed                SLP Visit Diagnosis: Cognitive communication deficit (R00.762) Plan: All goals met       GO                , Katherene Ponto 10/10/2017, 2:08 PM

## 2017-10-10 NOTE — Evaluation (Signed)
Physical Therapy Evaluation Patient Details Name: JAVERIA BRISKI MRN: [id number] DOB: 12/31/1940 Today's Date: 10/10/2017   History of Present Illness  77 yo admitted after fall landing on her head with decreased mental status. Pt with scattered SAH, frontal and occipital SDH. PMhx: breast CA  Clinical Impression  Pt very pleasant with report of feeling generally blah today. Pt with good strength and function overall with noted balance deficits particularly with narrowed BOS and SLS. Pt educated for recommendation to have railing installed on entrance stairs and to use cane at all times currently to decrease LOB. Pt with above deficits who will benefit from acute therapy to maximize mobility, function and gait to decrease balance deficits and fall risk.      Follow Up Recommendations Outpatient PT    Equipment Recommendations  Cane    Recommendations for Other Services       Precautions / Restrictions Precautions Precautions: Fall Restrictions Weight Bearing Restrictions: No      Mobility  Bed Mobility Overal bed mobility: Modified Independent                Transfers Overall transfer level: Modified independent                  Ambulation/Gait Ambulation/Gait assistance: Min guard Ambulation Distance (Feet): 300 Feet Assistive device: None Gait Pattern/deviations: Step-through pattern;Decreased stride length   Gait velocity interpretation: Below normal speed for age/gender General Gait Details: pt with decreased speed, cautious gait and ability to perform slow turns without LOB.   Stairs Stairs: Yes Stairs assistance: Supervision Stair Management: Alternating pattern;Forwards;One rail Left Number of Stairs: 11 General stair comments: pt able to complete stairs safely with use of rail   Wheelchair Mobility    Modified Rankin (Stroke Patients Only) Modified Rankin (Stroke Patients Only) Pre-Morbid Rankin Score: No symptoms Modified Rankin:  Moderate disability     Balance Overall balance assessment: Needs assistance   Sitting balance-Leahy Scale: Good       Standing balance-Leahy Scale: Good   Single Leg Stance - Right Leg: 2 Single Leg Stance - Left Leg: 2 Tandem Stance - Right Leg: 2 Tandem Stance - Left Leg: 8 [place]berg - Eyes Opened: 60 [place]berg - Eyes Closed: 25   High Level Balance Comments: pt with LOB with SLS and higher level balance deficits, unsteady with stepping to Tandem stance but able to hold             Pertinent Vitals/Pain Pain Assessment: No/denies pain    Home Living Family/patient expects to be discharged to:: Private residence Living Arrangements: Spouse/significant other Available Help at Discharge: Family;Available 24 hours/day Type of Home: House Home Access: Stairs to enter Entrance Stairs-Rails: None Entrance Stairs-Number of Steps: 3 Home Layout: Two level;Bed/bath upstairs Home Equipment: Walker - 2 wheels;Cane - single point;Wheelchair - manual Additional Comments: equipment in attic somewhere that belonged to pt's mother    Prior Function Level of Independence: Independent               Hand Dominance        Extremity/Trunk Assessment   Upper Extremity Assessment Upper Extremity Assessment: Overall WFL for tasks assessed    Lower Extremity Assessment Lower Extremity Assessment: Overall WFL for tasks assessed    Cervical / Trunk Assessment Cervical / Trunk Assessment: Normal  Communication   Communication: No difficulties  Cognition Arousal/Alertness: Awake/alert Behavior During Therapy: WFL for tasks assessed/performed Overall Cognitive Status: Within Functional Limits for tasks assessed  General Comments      Exercises     Assessment/Plan    PT Assessment Patient needs continued PT services  PT Problem List Decreased mobility;Decreased activity tolerance;Decreased balance;Decreased  knowledge of use of DME       PT Treatment Interventions Gait training;Therapeutic exercise;Patient/family education;Balance training;Functional mobility training;DME instruction;Therapeutic activities    PT Goals (Current goals can be found in the Care Plan section)  Acute Rehab PT Goals Patient Stated Goal: return to [employer] PT Goal Formulation: With patient/family Time For Goal Achievement: 10/24/17 Potential to Achieve Goals: Good    Frequency Min 3X/week   Barriers to discharge        Co-evaluation               AM-PAC PT "6 Clicks" Daily Activity  Outcome Measure Difficulty turning over in bed (including adjusting bedclothes, sheets and blankets)?: None Difficulty moving from lying on back to sitting on the side of the bed? : None Difficulty sitting down on and standing up from a chair with arms (e.g., wheelchair, bedside commode, etc,.)?: None Help needed moving to and from a bed to chair (including a wheelchair)?: A Little Help needed walking in hospital room?: A Little Help needed climbing 3-5 steps with a railing? : A Little 6 Click Score: 21    End of Session Equipment Utilized During Treatment: Gait belt Activity Tolerance: Patient tolerated treatment well Patient left: in bed;with call bell/phone within reach;with family/visitor present;Other (comment)(with OT) Nurse Communication: Mobility status PT Visit Diagnosis: Other abnormalities of gait and mobility (R26.89)    Time: 8921-1941 PT Time Calculation (min) (ACUTE ONLY): 18 min   Charges:   PT Evaluation $PT Eval Moderate Complexity: 1 Mod     PT G Codes:        Elwyn Reach, PT [phone]   [place] B Damya Comley 10/10/2017, 10:50 AM

## 2017-10-10 NOTE — Progress Notes (Signed)
Patient transferred from 4N ICU, vitals stable, oriented x4, oriented to room/unit. Family at bedside with all belongings.

## 2017-10-10 NOTE — Evaluation (Signed)
Speech Language Pathology Evaluation Patient Details Name: Sharon Zavala MRN: [id number] DOB: 03/25/1941 Today's Date: 10/10/2017 Time: 1200-1230 SLP Time Calculation (min) (ACUTE ONLY): 30 min  Problem List:  Patient Active Problem List   Diagnosis Date Noted  . Closed head injury 10/07/2017  . Routine health maintenance 09/24/2011  . Hyperlipidemia 01/27/2009  . Malignant neoplasm of breast (female), unspecified site 11/27/2007  . Essential hypertension 11/27/2007   Past Medical History:  Past Medical History:  Diagnosis Date  . 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 Routine general medical examination at a health care facility   . Unspecified essential hypertension    Past Surgical History:  Past Surgical History:  Procedure Laterality Date  . MASTECTOMY MODIFIED RADICAL  '89 & '97   Bilateral  . TONSILLECTOMY     HPI:  77 year old patient presented 10/07/17 after a fall in her garage. Husband states that she tripped on the steps and fell backwards on her head. He reports that she has been confused and sleepy since about 2:00pm when the incident happened. She does not have any significant medical history other than breast cancer. CT 10/08/17 showed increased size of right frontal greater than left occipital and cerebellar hemorrhagic contusions and associated mild edema, unchanged scattered subarachnoid hemorrhage, subdural hematomas over the left cerebral convexity and along the falx. Unchanged thin left tentorial subdural hematoma.   Assessment / Plan / Recommendation Clinical Impression  Pt cognition is relatively close to baseline; affect now normal, pt able to fully participate in assessment,  dramitically different than prior days. Pt scored 25 out of 30 on [place] cognitive assessment with primary area of impairment being fluency and naming task, though conversational language is clearly WNL. Memory, high level problem solving and alternating  attention are all adequate. Discussed need for supervision after d/c with ADLs and further f/u if any impairment persists after d/c. No acute SLP f/u needed.     SLP Assessment  SLP Recommendation/Assessment: Patient does not need any further Speech [place] [facility] Pathology Services SLP Visit Diagnosis: Cognitive communication deficit (R41.841)    Follow Up Recommendations       Frequency and Duration           SLP Evaluation Cognition  Overall Cognitive Status: Within Functional Limits for tasks assessed Orientation Level: Oriented X4       Comprehension  Auditory Comprehension Overall Auditory Comprehension: Appears within functional limits for tasks assessed    Expression Verbal Expression Overall Verbal Expression: Appears within functional limits for tasks assessed   Oral / Motor  Motor Speech Overall Motor Speech: Appears within functional limits for tasks assessed   GO                   [name], MA CCC-SLP [phone]  [name] 10/10/2017, 2:06 PM

## 2017-10-11 ENCOUNTER — Other Ambulatory Visit: Payer: Self-pay

## 2017-10-11 MED ORDER — POLYETHYLENE GLYCOL 3350 17 G PO PACK
17.0000 g | PACK | Freq: Once | ORAL | Status: AC
  Administered 2017-10-11: 17 g via ORAL

## 2017-10-11 NOTE — Care Management Note (Signed)
Case Management Note  Patient Details  Name: LASHANTE FRYBERGER MRN: [id number] Date of Birth: May 23, 1941  Subjective/Objective:  77 yo admitted after fall landing on her head with decreased mental status. Pt with scattered SAH, frontal and occipital SDH. PMhx: breast CA.  PTA, pt independent, lives at home with spouse.                    Action/Plan: Pt recommending OP follow up; OT recommending no OP follow up.  MD ordered HHPT, but pt prefers to do OP PT.  Referral made to [facility] for OP follow up.  Pt states she has cane and BSC at home, if needed.   Expected Discharge Date:  10/11/17               Expected Discharge Plan:  OP Rehab  In-House Referral:     Discharge planning Services  CM Consult  Post Acute Care Choice:    Choice offered to:     DME Arranged:    DME Agency:     HH Arranged:    HH Agency:     Status of Service:  Completed, signed off  If discussed at [employer] of Stay Meetings, dates discussed:    Additional Comments:  Reinaldo Raddle, RN, BSN  Trauma/Neuro ICU Case Manager [phone]

## 2017-10-11 NOTE — Plan of Care (Signed)
  Progressing Education: Knowledge of General Education information will improve 10/11/2017 0036 - Progressing by Randal Buba, RN Health Behavior/Discharge Planning: Ability to manage health-related needs will improve 10/11/2017 0036 - Progressing by Randal Buba, RN Clinical Measurements: Ability to maintain clinical measurements within normal limits will improve 10/11/2017 0036 - Progressing by Randal Buba, RN Will remain free from infection 10/11/2017 0036 - Progressing by Randal Buba, RN Diagnostic test results will improve 10/11/2017 0036 - Progressing by Randal Buba, RN Respiratory complications will improve 10/11/2017 0036 - Progressing by Randal Buba, RN Cardiovascular complication will be avoided 10/11/2017 0036 - Progressing by Randal Buba, RN Activity: Risk for activity intolerance will decrease 10/11/2017 0036 - Progressing by Randal Buba, RN Nutrition: Adequate nutrition will be maintained 10/11/2017 0036 - Progressing by Randal Buba, RN Coping: Level of anxiety will decrease 10/11/2017 0036 - Progressing by Randal Buba, RN Elimination: Will not experience complications related to bowel motility 10/11/2017 0036 - Progressing by Randal Buba, RN Will not experience complications related to urinary retention 10/11/2017 0036 - Progressing by Randal Buba, RN Pain Managment: General experience of comfort will improve 10/11/2017 0036 - Progressing by Randal Buba, RN Safety: Ability to remain free from injury will improve 10/11/2017 0036 - Progressing by Randal Buba, RN Skin Integrity: Risk for impaired skin integrity will decrease 10/11/2017 0036 - Progressing by Randal Buba, RN

## 2017-10-11 NOTE — Progress Notes (Signed)
Patient is discharged from room 4NP 08 at this time. Alert and in stable condition. IV site d/c'd and instructions read to patient and spouse with understanding verbalized. Left unit via wheelchair with all belongings at side.

## 2017-10-11 NOTE — Discharge Summary (Signed)
Physician Discharge Summary  Patient ID: Sharon Zavala MRN: [id number] DOB/AGE: 1941/01/08 77 y.o.  Admit date: 10/07/2017 Discharge date: 10/11/2017  Admission Diagnoses: [place]    Discharge Diagnoses: same   Discharged Condition: stable  Hospital Course: The patient was admitted on 10/07/2017 with a CHI suffered during a fall. She was admitted to the ICU in guarded condition. The hospital course was routine. There were no complications. Pt had appropriate soreness. No complaints of arm or leg pain or new N/T/W. The patient remained afebrile with stable vital signs, and tolerated a regular diet. The patient continued to increase activities, and pain was well controlled with oral pain medications. She did well with PT, F/U CT head was stable.  Consults: None  Significant Diagnostic Studies:  Results for orders placed or performed during the hospital encounter of 10/07/17  Surgical pcr screen  Result Value Ref Range   MRSA, PCR NEGATIVE NEGATIVE   Staphylococcus aureus NEGATIVE NEGATIVE  MRSA PCR Screening  Result Value Ref Range   MRSA by PCR NEGATIVE NEGATIVE  CBC with Differential  Result Value Ref Range   WBC 14.2 (H) 4.0 - 10.5 K/uL   RBC 4.49 3.87 - 5.11 MIL/uL   Hemoglobin 14.2 12.0 - 15.0 g/dL   HCT 41.4 36.0 - 46.0 %   MCV 92.2 78.0 - 100.0 fL   MCH 31.6 26.0 - 34.0 pg   MCHC 34.3 30.0 - 36.0 g/dL   RDW 13.2 11.5 - 15.5 %   Platelets 304 150 - 400 K/uL   Neutrophils Relative % 83 %   Neutro Abs 11.8 (H) 1.7 - 7.7 K/uL   Lymphocytes Relative 12 %   Lymphs Abs 1.6 0.7 - 4.0 K/uL   Monocytes Relative 5 %   Monocytes Absolute 0.7 0.1 - 1.0 K/uL   Eosinophils Relative 0 %   Eosinophils Absolute 0.0 0.0 - 0.7 K/uL   Basophils Relative 0 %   Basophils Absolute 0.0 0.0 - 0.1 K/uL  Glucose, capillary  Result Value Ref Range   Glucose-Capillary 127 (H) 65 - 99 mg/dL  I-stat Chem 8, ED  Result Value Ref Range   Sodium 140 135 - 145 mmol/L   Potassium 3.0 (L) 3.5 -  5.1 mmol/L   Chloride 101 101 - 111 mmol/L   BUN 15 6 - 20 mg/dL   Creatinine, Ser 0.70 0.44 - 1.00 mg/dL   Glucose, Bld 128 (H) 65 - 99 mg/dL   Calcium, Ion 1.13 (L) 1.15 - 1.40 mmol/L   TCO2 25 22 - 32 mmol/L   Hemoglobin 14.6 12.0 - 15.0 g/dL   HCT 43.0 36.0 - 46.0 %  I-stat troponin, ED  Result Value Ref Range   Troponin i, poc 0.00 0.00 - 0.08 ng/mL   Comment 3            Dg Lumbar Spine Complete  Result Date: 10/09/2017 CLINICAL DATA:  Low back pain, fall couple of days ago. EXAM: LUMBAR SPINE - COMPLETE 4+ VIEW COMPARISON:  None. 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 No fracture line or displaced fracture fragment identified. No compression fracture deformity. Degenerative changes are seen throughout the lumbar spine, at least moderate in degree, with associated disc space narrowing and osseous spurring. Degenerative facet hypertrophy is also present from L2 through S1 with likely some degree of associated osseous neural foramen encroachment. Upper sacrum appears intact and normally aligned. Visualized paravertebral soft tissues are unremarkable. IMPRESSION: 1. Degenerative changes, at least moderate in  degree, as detailed above. If any radiculopathic symptoms, would consider nonemergent lumbar spine MRI to exclude associated nerve root impingement. 2. Scoliosis. 3. No acute findings. Electronically Signed   By: Franki Cabot M.D.   On: 10/09/2017 13:41   Ct Head Wo Contrast  Result Date: 10/08/2017 CLINICAL DATA:  Altered level of consciousness. Fall with head injury and intracranial hemorrhage. EXAM: CT HEAD WITHOUT CONTRAST TECHNIQUE: Contiguous axial images were obtained from the base of the skull through the vertex without intravenous contrast. COMPARISON:  10/07/2017 FINDINGS: Brain: Thin subdural hematoma over the left cerebral convexity measures 2 mm in thickness (previously 4 mm). Thin subdural hematoma anteriorly along the falx also appears  minimally diminished. Thin subdural hematoma along the left tentorium is unchanged. Small hemorrhagic contusions are again seen in the left cerebellum and left occipital lobe with minimally increased edema but no significant mass effect. Hemorrhagic contusion in the inferior right frontal lobe has enlarged, now measuring 4.6 cm. There is mild surrounding edema which has increased. Scattered subarachnoid hemorrhage over the cerebral and cerebellar convexities does not appear significantly changed in overall volume. There is no midline shift, acute large territory infarct, or evidence of hydrocephalus. The basilar cisterns are patent. Vascular: No hyperdense vessel. Skull: Unchanged nondepressed left occipital skull fractures. Sinuses/Orbits: Small volume fluid and mucosal thickening in a few right ethmoid air cells. Clear mastoid air cells. Unremarkable orbits. Other: Similar mild posterior scalp soft tissue swelling/contusion. IMPRESSION: 1. Increased size of right frontal greater than left occipital and cerebellar hemorrhagic contusions and associated mild edema. 2. Unchanged scattered subarachnoid hemorrhage. 3. Slightly decreased thickness of thin subdural hematomas over the left cerebral convexity and along the falx. Unchanged thin left tentorial subdural hematoma. Electronically Signed   By: Logan Bores M.D.   On: 10/08/2017 06:51   Ct Head Wo Contrast  Result Date: 10/07/2017 CLINICAL DATA:  Fall with head injury. Initial encounter. EXAM: CT HEAD WITHOUT CONTRAST CT CERVICAL SPINE WITHOUT CONTRAST TECHNIQUE: Multidetector CT imaging of the head and cervical spine was performed following the standard protocol without intravenous contrast. Multiplanar CT image reconstructions of the cervical spine were also generated. COMPARISON:  None. FINDINGS: CT HEAD FINDINGS Brain: Scattered traumatic appearing subarachnoid blood along the cerebral and cerebellar convexities. Hemorrhagic contusion in the inferior right  frontal lobe involving a maximal 3 cm diameter area. Smaller hemorrhagic contusion in the left occipital pole and left cerebellum. Thin subdural hematoma along the left cerebral convexity, left tentorium and anterior falx. The left frontal subdural measures up to 4 mm in thickness. No visible infarct. No hydrocephalus Vascular: No hyperdense vessel or unexpected calcification. Skull: Nondepressed linear skull fracture through the left paramedian occipital bone. There is a separate limited fracture through the posterior foramen magnum. Overlying scalp contusion. Sinuses/Orbits: No evidence of injury. Other: Incidental parotid calcifications CT CERVICAL SPINE FINDINGS Alignment: Normal. Skull base and vertebrae: Negative for fracture Soft tissues and spinal canal: No prevertebral fluid or swelling. No visible canal hematoma. Disc levels: C5-6 and C6-7 degenerative disc narrowing and spurring. Asymmetric left facet degeneration and spurring with C3-4 ankylosis. Upper chest: Negative Critical Value/emergent results were called by telephone at the time of interpretation on 10/07/2017 at 4:57 pm to Dr. Willette Alma , who verbally acknowledged these results. IMPRESSION: 1. Right inferior frontal, left occipital pole, and left cerebellar hemorrhagic contusions. 2. Patchy traumatic pattern subarachnoid hemorrhage. 3. Thin subdural hematomas along the falx, left tentorium, and left cerebral convexity. 4. Nondepressed occipital bone fractures. 5. Negative for  cervical spine fracture. Electronically Signed   By: Monte Fantasia M.D.   On: 10/07/2017 17:00   Ct Cervical Spine Wo Contrast  Result Date: 10/07/2017 CLINICAL DATA:  Fall with head injury. Initial encounter. EXAM: CT HEAD WITHOUT CONTRAST CT CERVICAL SPINE WITHOUT CONTRAST TECHNIQUE: Multidetector CT imaging of the head and cervical spine was performed following the standard protocol without intravenous contrast. Multiplanar CT image reconstructions of the cervical  spine were also generated. COMPARISON:  None. FINDINGS: CT HEAD FINDINGS Brain: Scattered traumatic appearing subarachnoid blood along the cerebral and cerebellar convexities. Hemorrhagic contusion in the inferior right frontal lobe involving a maximal 3 cm diameter area. Smaller hemorrhagic contusion in the left occipital pole and left cerebellum. Thin subdural hematoma along the left cerebral convexity, left tentorium and anterior falx. The left frontal subdural measures up to 4 mm in thickness. No visible infarct. No hydrocephalus Vascular: No hyperdense vessel or unexpected calcification. Skull: Nondepressed linear skull fracture through the left paramedian occipital bone. There is a separate limited fracture through the posterior foramen magnum. Overlying scalp contusion. Sinuses/Orbits: No evidence of injury. Other: Incidental parotid calcifications CT CERVICAL SPINE FINDINGS Alignment: Normal. Skull base and vertebrae: Negative for fracture Soft tissues and spinal canal: No prevertebral fluid or swelling. No visible canal hematoma. Disc levels: C5-6 and C6-7 degenerative disc narrowing and spurring. Asymmetric left facet degeneration and spurring with C3-4 ankylosis. Upper chest: Negative Critical Value/emergent results were called by telephone at the time of interpretation on 10/07/2017 at 4:57 pm to Dr. Willette Alma , who verbally acknowledged these results. IMPRESSION: 1. Right inferior frontal, left occipital pole, and left cerebellar hemorrhagic contusions. 2. Patchy traumatic pattern subarachnoid hemorrhage. 3. Thin subdural hematomas along the falx, left tentorium, and left cerebral convexity. 4. Nondepressed occipital bone fractures. 5. Negative for cervical spine fracture. Electronically Signed   By: Monte Fantasia M.D.   On: 10/07/2017 17:00    Antibiotics:  Anti-infectives (From admission, onward)   None      Discharge Exam: Blood pressure (!) 164/78, pulse 65, temperature 98.8 F (37.1  C), temperature source Oral, resp. rate 16, height 5\' 4"  (1.626 m), weight 74.5 kg (164 lb 3.9 oz), SpO2 96 %. Neurologic: Grossly normal No neck tenderness, cleared clinically  Discharge Medications:   Allergies as of 10/11/2017   No Known Allergies     Medication List    STOP taking these medications   aspirin 81 MG tablet     TAKE these medications   acyclovir 400 MG tablet Commonly known as:  ZOVIRAX Take 1 tablet (400 mg total) by mouth 3 (three) times daily.   lisinopril-hydrochlorothiazide 10-12.5 MG tablet Commonly known as:  PRINZIDE,ZESTORETIC TAKE 1 TABLET BY MOUTH DAILY.       Disposition: home with [facility]   Final Dx: CHI  Discharge Instructions    Call MD for:  difficulty breathing, headache or visual disturbances   Complete by:  As directed    Call MD for:  persistant dizziness or light-headedness   Complete by:  As directed    Call MD for:  persistant nausea and vomiting   Complete by:  As directed    Call MD for:  severe uncontrolled pain   Complete by:  As directed    Call MD for:  temperature >100.4   Complete by:  As directed    Diet - low sodium heart healthy   Complete by:  As directed    Increase activity slowly   Complete by:  As directed       Follow-up Information    Eustace Moore, MD. Schedule an appointment as soon as possible for a visit in 2 week(s).   Specialty:  Neurosurgery Contact information: [address] [place] [address] [place] [address] [phone]            Signed: Eustace Moore 10/11/2017, 7:22 AM

## 2017-10-11 NOTE — Care Management Important Message (Signed)
Important Message  Patient Details  Name: Sharon Zavala MRN: [id number] Date of Birth: Dec 09, 1940   Medicare Important Message Given:  Yes    Orbie Pyo 10/11/2017, 12:05 PM

## 2017-10-12 ENCOUNTER — Telehealth: Payer: Self-pay | Admitting: *Deleted

## 2017-10-12 NOTE — Telephone Encounter (Signed)
Pt was on TCM list admitted 10/07/2017 with a CHI suffered during a fall. Pt was D/c 10/11/17, and will f/u w/her neurosurgeon Dr. Sherley Bounds in 2 weeks.Marland KitchenJohny Chess

## 2017-10-14 ENCOUNTER — Ambulatory Visit: Payer: Medicare HMO

## 2017-10-14 ENCOUNTER — Encounter: Payer: Medicare HMO | Admitting: Internal Medicine

## 2017-10-21 ENCOUNTER — Ambulatory Visit: Payer: Medicare HMO

## 2017-10-21 ENCOUNTER — Telehealth: Payer: Self-pay | Admitting: Internal Medicine

## 2017-10-21 DIAGNOSIS — S065X9D Traumatic subdural hemorrhage with loss of consciousness of unspecified duration, subsequent encounter: Secondary | ICD-10-CM | POA: Diagnosis not present

## 2017-10-21 DIAGNOSIS — Z853 Personal history of malignant neoplasm of breast: Secondary | ICD-10-CM | POA: Diagnosis not present

## 2017-10-21 DIAGNOSIS — Z9013 Acquired absence of bilateral breasts and nipples: Secondary | ICD-10-CM | POA: Diagnosis not present

## 2017-10-21 DIAGNOSIS — I10 Essential (primary) hypertension: Secondary | ICD-10-CM | POA: Diagnosis not present

## 2017-10-21 DIAGNOSIS — W108XXD Fall (on) (from) other stairs and steps, subsequent encounter: Secondary | ICD-10-CM | POA: Diagnosis not present

## 2017-10-21 NOTE — Telephone Encounter (Signed)
Clair Gulling has called in with two concerns.  First: Patients BP seated 160/80, standing 130/80 Dizzy when she touches the ground Patient has just got out of hospital with concussion.   HR at rest 110 120 w/ exertion Poor appetite  Light sensitivity Clair Gulling is concerned about patients cardiac status and would like to know what patient should do.    Dr. Sharlet Salina is out of the office today. I have spoken with Dr. Quay Burow - she has advised patient to go back to the ED for evaluation.  I have called the patients spouse [facility] [phone]) per Clair Gulling (states patient is not answering her phone due to just getting out of the hospital).  Spouse states that patient denies dizziness.  Spouse did speak with patient. Patient does not have a hospital follow up scheduled but does have a CPE coming up.  I have asked to schedule hospital fu sooner. Spouse states patient is not sure what she wants to do right now as far as going to the hospital or even making hospital follow up.  I did follow back up with Clair Gulling.  He stated that he spoke with patients neurologist and they suggested patient go to [facility] ED.  States he phoned spouse  and spouse stated that he thought I had suggested coming in for a hospital follow up next week but not ED.     I did follow back up with spouse to make sure he understood  that our office suggested evaluation at the ED for today and then following up with our office next week.  Spouse stated understanding and that he would speak with patient in regard to see what she would like to do.    Second: Requesting orders: PT 2x a week for three weeks for fall risk reduction  evaluation by OT therapist

## 2017-10-24 NOTE — Telephone Encounter (Signed)
Agree with ER and follow up visit.

## 2017-10-25 NOTE — Telephone Encounter (Signed)
Okay but needs visit within 30 days of orders.

## 2017-10-25 NOTE — Telephone Encounter (Signed)
Sharon Zavala is requesting orders: PT 2x a week for three weeks for fall risk reduction  evaluation by OT therapist

## 2017-10-25 NOTE — Telephone Encounter (Signed)
LVM for jim giving orders, can you please schedule patient a visit. Thank you

## 2017-10-26 DIAGNOSIS — Z853 Personal history of malignant neoplasm of breast: Secondary | ICD-10-CM | POA: Diagnosis not present

## 2017-10-26 DIAGNOSIS — Z9013 Acquired absence of bilateral breasts and nipples: Secondary | ICD-10-CM | POA: Diagnosis not present

## 2017-10-26 DIAGNOSIS — S065X9D Traumatic subdural hemorrhage with loss of consciousness of unspecified duration, subsequent encounter: Secondary | ICD-10-CM | POA: Diagnosis not present

## 2017-10-26 DIAGNOSIS — W108XXD Fall (on) (from) other stairs and steps, subsequent encounter: Secondary | ICD-10-CM | POA: Diagnosis not present

## 2017-10-26 DIAGNOSIS — I10 Essential (primary) hypertension: Secondary | ICD-10-CM | POA: Diagnosis not present

## 2017-10-26 NOTE — Telephone Encounter (Signed)
Left patient vm °

## 2017-10-27 ENCOUNTER — Telehealth: Payer: Self-pay | Admitting: Internal Medicine

## 2017-10-27 DIAGNOSIS — S065X9D Traumatic subdural hemorrhage with loss of consciousness of unspecified duration, subsequent encounter: Secondary | ICD-10-CM | POA: Diagnosis not present

## 2017-10-27 DIAGNOSIS — I10 Essential (primary) hypertension: Secondary | ICD-10-CM | POA: Diagnosis not present

## 2017-10-27 DIAGNOSIS — Z853 Personal history of malignant neoplasm of breast: Secondary | ICD-10-CM | POA: Diagnosis not present

## 2017-10-27 DIAGNOSIS — W108XXD Fall (on) (from) other stairs and steps, subsequent encounter: Secondary | ICD-10-CM | POA: Diagnosis not present

## 2017-10-27 DIAGNOSIS — Z9013 Acquired absence of bilateral breasts and nipples: Secondary | ICD-10-CM | POA: Diagnosis not present

## 2017-10-27 NOTE — Telephone Encounter (Signed)
Fine

## 2017-10-27 NOTE — Telephone Encounter (Signed)
Pt has scheduled appt for 11/07/17...Sharon Zavala

## 2017-10-27 NOTE — Telephone Encounter (Signed)
Sharon Zavala with [facility] [phone]) called requesting verbal orders for OT. Requested is 1 x a week for one week, 2 times a week for one week, and 1 time a week for 1 week. Please call Sharon Zavala at [phone] if you have any questions.

## 2017-10-28 NOTE — Telephone Encounter (Signed)
Notified susan w/MD response../l;mb

## 2017-10-31 DIAGNOSIS — Z9013 Acquired absence of bilateral breasts and nipples: Secondary | ICD-10-CM | POA: Diagnosis not present

## 2017-10-31 DIAGNOSIS — S065X9A Traumatic subdural hemorrhage with loss of consciousness of unspecified duration, initial encounter: Secondary | ICD-10-CM | POA: Diagnosis not present

## 2017-10-31 DIAGNOSIS — Z853 Personal history of malignant neoplasm of breast: Secondary | ICD-10-CM | POA: Diagnosis not present

## 2017-10-31 DIAGNOSIS — I10 Essential (primary) hypertension: Secondary | ICD-10-CM | POA: Diagnosis not present

## 2017-10-31 DIAGNOSIS — Z6828 Body mass index (BMI) 28.0-28.9, adult: Secondary | ICD-10-CM | POA: Diagnosis not present

## 2017-10-31 DIAGNOSIS — W108XXD Fall (on) (from) other stairs and steps, subsequent encounter: Secondary | ICD-10-CM | POA: Diagnosis not present

## 2017-10-31 DIAGNOSIS — S065X9D Traumatic subdural hemorrhage with loss of consciousness of unspecified duration, subsequent encounter: Secondary | ICD-10-CM | POA: Diagnosis not present

## 2017-11-02 ENCOUNTER — Other Ambulatory Visit: Payer: Self-pay | Admitting: Neurological Surgery

## 2017-11-02 DIAGNOSIS — S065XAA Traumatic subdural hemorrhage with loss of consciousness status unknown, initial encounter: Secondary | ICD-10-CM

## 2017-11-02 DIAGNOSIS — S065X9A Traumatic subdural hemorrhage with loss of consciousness of unspecified duration, initial encounter: Secondary | ICD-10-CM

## 2017-11-07 ENCOUNTER — Encounter: Payer: Self-pay | Admitting: Internal Medicine

## 2017-11-07 ENCOUNTER — Ambulatory Visit (INDEPENDENT_AMBULATORY_CARE_PROVIDER_SITE_OTHER): Payer: Medicare HMO | Admitting: Internal Medicine

## 2017-11-07 ENCOUNTER — Other Ambulatory Visit (INDEPENDENT_AMBULATORY_CARE_PROVIDER_SITE_OTHER): Payer: Medicare HMO

## 2017-11-07 ENCOUNTER — Ambulatory Visit (INDEPENDENT_AMBULATORY_CARE_PROVIDER_SITE_OTHER): Payer: Medicare HMO | Admitting: *Deleted

## 2017-11-07 VITALS — BP 132/70 | HR 86 | Temp 98.3°F | Ht 64.0 in | Wt 158.0 lb

## 2017-11-07 DIAGNOSIS — I10 Essential (primary) hypertension: Secondary | ICD-10-CM

## 2017-11-07 DIAGNOSIS — E785 Hyperlipidemia, unspecified: Secondary | ICD-10-CM

## 2017-11-07 DIAGNOSIS — S0990XA Unspecified injury of head, initial encounter: Secondary | ICD-10-CM

## 2017-11-07 DIAGNOSIS — Z0000 Encounter for general adult medical examination without abnormal findings: Secondary | ICD-10-CM

## 2017-11-07 LAB — COMPREHENSIVE METABOLIC PANEL
ALT: 14 U/L (ref 0–35)
AST: 13 U/L (ref 0–37)
Albumin: 3.8 g/dL (ref 3.5–5.2)
Alkaline Phosphatase: 95 U/L (ref 39–117)
BILIRUBIN TOTAL: 0.6 mg/dL (ref 0.2–1.2)
BUN: 11 mg/dL (ref 6–23)
CALCIUM: 10 mg/dL (ref 8.4–10.5)
CHLORIDE: 105 meq/L (ref 96–112)
CO2: 29 meq/L (ref 19–32)
CREATININE: 0.89 mg/dL (ref 0.40–1.20)
GFR: 65.65 mL/min (ref >60.00)
GLUCOSE: 101 mg/dL — AB (ref 70–99)
Potassium: 4.9 mEq/L (ref 3.5–5.1)
SODIUM: 143 meq/L (ref 135–145)
Total Protein: 6.6 g/dL (ref 6.0–8.3)

## 2017-11-07 LAB — CBC
HEMATOCRIT: 42.6 % (ref 36.0–46.0)
Hemoglobin: 14.7 g/dL (ref 12.0–15.0)
MCHC: 34.6 g/dL (ref 30.0–36.0)
MCV: 93.8 fl (ref 78.0–100.0)
PLATELETS: 356 10*3/uL (ref 150.0–400.0)
RBC: 4.54 Mil/uL (ref 3.87–5.11)
RDW: 13.4 % (ref 11.5–15.5)
WBC: 7.5 10*3/uL (ref 4.0–10.5)

## 2017-11-07 LAB — LIPID PANEL
CHOL/HDL RATIO: 3
Cholesterol: 186 mg/dL (ref 0–200)
HDL: 61.4 mg/dL (ref >39.00)
LDL CALC: 105 mg/dL — AB (ref 0–99)
NONHDL: 124.71
TRIGLYCERIDES: 101 mg/dL (ref 0.0–149.0)
VLDL: 20.2 mg/dL (ref 0.0–40.0)

## 2017-11-07 MED ORDER — LISINOPRIL-HYDROCHLOROTHIAZIDE 10-12.5 MG PO TABS: 1.0000 | ORAL_TABLET | Freq: Every day | ORAL | 3 refills | Status: DC

## 2017-11-07 NOTE — Assessment & Plan Note (Signed)
Pneumonia and tetanus up to date. Given information on shingrix. Flu up to date. Has had zostavax. Colonoscopy due 2019 July and wants to do cologuard in November or later. Counseled about sun safety and mole surveillance. Given 10 year screening recommendations.

## 2017-11-07 NOTE — Progress Notes (Signed)
Medical screening examination/treatment/procedure(s) were performed by non-physician practitioner and as supervising physician I was immediately available for consultation/collaboration. I agree with above. Daivon Rayos A Joetta Delprado, MD 

## 2017-11-07 NOTE — Patient Instructions (Signed)
We will order the cologuard around November this year.   The new shingles vaccine is called shingrix so ask at the pharmacy about the cost and let us know if you want to get it.  Health Maintenance, Female Adopting a healthy lifestyle and getting preventive care can go a long way to promote health and wellness. Talk with your health care provider about what schedule of regular examinations is right for you. This is a good chance for you to check in with your provider about disease prevention and staying healthy. In between checkups, there are plenty of things you can do on your own. Experts have done a lot of research about which lifestyle changes and preventive measures are most likely to keep you healthy. Ask your health care provider for more information. Weight and diet Eat a healthy diet  Be sure to include plenty of vegetables, fruits, low-fat dairy products, and lean protein.  Do not eat a lot of foods high in solid fats, added sugars, or salt.  Get regular exercise. This is one of the most important things you can do for your health. ? Most adults should exercise for at least 150 minutes each week. The exercise should increase your heart rate and make you sweat (moderate-intensity exercise). ? Most adults should also do strengthening exercises at least twice a week. This is in addition to the moderate-intensity exercise.  Maintain a healthy weight  Body mass index (BMI) is a measurement that can be used to identify possible weight problems. It estimates body fat based on height and weight. Your health care provider can help determine your BMI and help you achieve or maintain a healthy weight.  For females 76 years of age and older: ? A BMI below 18.5 is considered underweight. ? A BMI of 18.5 to 24.9 is normal. ? A BMI of 25 to 29.9 is considered overweight. ? A BMI of 30 and above is considered obese.  Watch levels of cholesterol and blood lipids  You should start having your  blood tested for lipids and cholesterol at 76 years of age, then have this test every 5 years.  You may need to have your cholesterol levels checked more often if: ? Your lipid or cholesterol levels are high. ? You are older than 76 years of age. ? You are at high risk for heart disease.  Cancer screening Lung Cancer  Lung cancer screening is recommended for adults 21-36 years old who are at high risk for lung cancer because of a history of smoking.  A yearly low-dose CT scan of the lungs is recommended for people who: ? Currently smoke. ? Have quit within the past 15 years. ? Have at least a 30-pack-year history of smoking. A pack year is smoking an average of one pack of cigarettes a day for 1 year.  Yearly screening should continue until it has been 15 years since you quit.  Yearly screening should stop if you develop a health problem that would prevent you from having lung cancer treatment.  Breast Cancer  Practice breast self-awareness. This means understanding how your breasts normally appear and feel.  It also means doing regular breast self-exams. Let your health care provider know about any changes, no matter how small.  If you are in your 76s or 30s, you should have a clinical breast exam (CBE) by a health care provider every 1-3 years as part of a regular health exam.  If you are 76 or older, have a CBE  every year. Also consider having a breast X-ray (mammogram) every year.  If you have a family history of breast cancer, talk to your health care provider about genetic screening.  If you are at high risk for breast cancer, talk to your health care provider about having an MRI and a mammogram every year.  Breast cancer gene (BRCA) assessment is recommended for women who have family members with BRCA-related cancers. BRCA-related cancers include: ? Breast. ? Ovarian. ? Tubal. ? Peritoneal cancers.  Results of the assessment will determine the need for genetic  counseling and BRCA1 and BRCA2 testing.  Cervical Cancer Your health care provider may recommend that you be screened regularly for cancer of the pelvic organs (ovaries, uterus, and vagina). This screening involves a pelvic examination, including checking for microscopic changes to the surface of your cervix (Pap test). You may be encouraged to have this screening done every 3 years, beginning at age 21.  For women ages 30-65, health care providers may recommend pelvic exams and Pap testing every 3 years, or they may recommend the Pap and pelvic exam, combined with testing for human papilloma virus (HPV), every 5 years. Some types of HPV increase your risk of cervical cancer. Testing for HPV may also be done on women of any age with unclear Pap test results.  Other health care providers may not recommend any screening for nonpregnant women who are considered low risk for pelvic cancer and who do not have symptoms. Ask your health care provider if a screening pelvic exam is right for you.  If you have had past treatment for cervical cancer or a condition that could lead to cancer, you need Pap tests and screening for cancer for at least 20 years after your treatment. If Pap tests have been discontinued, your risk factors (such as having a new sexual partner) need to be reassessed to determine if screening should resume. Some women have medical problems that increase the chance of getting cervical cancer. In these cases, your health care provider may recommend more frequent screening and Pap tests.  Colorectal Cancer  This type of cancer can be detected and often prevented.  Routine colorectal cancer screening usually begins at 76 years of age and continues through 75 years of age.  Your health care provider may recommend screening at an earlier age if you have risk factors for colon cancer.  Your health care provider may also recommend using home test kits to check for hidden blood in the  stool.  A small camera at the end of a tube can be used to examine your colon directly (sigmoidoscopy or colonoscopy). This is done to check for the earliest forms of colorectal cancer.  Routine screening usually begins at age 50.  Direct examination of the colon should be repeated every 5-10 years through 75 years of age. However, you may need to be screened more often if early forms of precancerous polyps or small growths are found.  Skin Cancer  Check your skin from head to toe regularly.  Tell your health care provider about any new moles or changes in moles, especially if there is a change in a mole's shape or color.  Also tell your health care provider if you have a mole that is larger than the size of a pencil eraser.  Always use sunscreen. Apply sunscreen liberally and repeatedly throughout the day.  Protect yourself by wearing long sleeves, pants, a wide-brimmed hat, and sunglasses whenever you are outside.  Heart disease, diabetes,   and high blood pressure  High blood pressure causes heart disease and increases the risk of stroke. High blood pressure is more likely to develop in: ? People who have blood pressure in the high end of the normal range (130-139/85-89 mm Hg). ? People who are overweight or obese. ? People who are African American.  If you are 18-39 years of age, have your blood pressure checked every 3-5 years. If you are 40 years of age or older, have your blood pressure checked every year. You should have your blood pressure measured twice-once when you are at a hospital or clinic, and once when you are not at a hospital or clinic. Record the average of the two measurements. To check your blood pressure when you are not at a hospital or clinic, you can use: ? An automated blood pressure machine at a pharmacy. ? A home blood pressure monitor.  If you are between 55 years and 79 years old, ask your health care provider if you should take aspirin to prevent  strokes.  Have regular diabetes screenings. This involves taking a blood sample to check your fasting blood sugar level. ? If you are at a normal weight and have a low risk for diabetes, have this test once every three years after 76 years of age. ? If you are overweight and have a high risk for diabetes, consider being tested at a younger age or more often. Preventing infection Hepatitis B  If you have a higher risk for hepatitis B, you should be screened for this virus. You are considered at high risk for hepatitis B if: ? You were born in a country where hepatitis B is common. Ask your health care provider which countries are considered high risk. ? Your parents were born in a high-risk country, and you have not been immunized against hepatitis B (hepatitis B vaccine). ? You have HIV or AIDS. ? You use needles to inject street drugs. ? You live with someone who has hepatitis B. ? You have had sex with someone who has hepatitis B. ? You get hemodialysis treatment. ? You take certain medicines for conditions, including cancer, organ transplantation, and autoimmune conditions.  Hepatitis C  Blood testing is recommended for: ? Everyone born from 1945 through 1965. ? Anyone with known risk factors for hepatitis C.  Sexually transmitted infections (STIs)  You should be screened for sexually transmitted infections (STIs) including gonorrhea and chlamydia if: ? You are sexually active and are younger than 76 years of age. ? You are older than 76 years of age and your health care provider tells you that you are at risk for this type of infection. ? Your sexual activity has changed since you were last screened and you are at an increased risk for chlamydia or gonorrhea. Ask your health care provider if you are at risk.  If you do not have HIV, but are at risk, it may be recommended that you take a prescription medicine daily to prevent HIV infection. This is called pre-exposure prophylaxis  (PrEP). You are considered at risk if: ? You are sexually active and do not regularly use condoms or know the HIV status of your partner(s). ? You take drugs by injection. ? You are sexually active with a partner who has HIV.  Talk with your health care provider about whether you are at high risk of being infected with HIV. If you choose to begin PrEP, you should first be tested for HIV. You should then   be tested every 3 months for as long as you are taking PrEP. Pregnancy  If you are premenopausal and you may become pregnant, ask your health care provider about preconception counseling.  If you may become pregnant, take 400 to 800 micrograms (mcg) of folic acid every day.  If you want to prevent pregnancy, talk to your health care provider about birth control (contraception). Osteoporosis and menopause  Osteoporosis is a disease in which the bones lose minerals and strength with aging. This can result in serious bone fractures. Your risk for osteoporosis can be identified using a bone density scan.  If you are 51 years of age or older, or if you are at risk for osteoporosis and fractures, ask your health care provider if you should be screened.  Ask your health care provider whether you should take a calcium or vitamin D supplement to lower your risk for osteoporosis.  Menopause may have certain physical symptoms and risks.  Hormone replacement therapy may reduce some of these symptoms and risks. Talk to your health care provider about whether hormone replacement therapy is right for you. Follow these instructions at home:  Schedule regular health, dental, and eye exams.  Stay current with your immunizations.  Do not use any tobacco products including cigarettes, chewing tobacco, or electronic cigarettes.  If you are pregnant, do not drink alcohol.  If you are breastfeeding, limit how much and how often you drink alcohol.  Limit alcohol intake to no more than 1 drink per day for  nonpregnant women. One drink equals 12 ounces of beer, 5 ounces of wine, or 1 ounces of hard liquor.  Do not use street drugs.  Do not share needles.  Ask your health care provider for help if you need support or information about quitting drugs.  Tell your health care provider if you often feel depressed.  Tell your health care provider if you have ever been abused or do not feel safe at home. This information is not intended to replace advice given to you by your health care provider. Make sure you discuss any questions you have with your health care provider. Document Released: 02/01/2011 Document Revised: 12/25/2015 Document Reviewed: 04/22/2015 [employer] Interactive Patient Education  [employer].

## 2017-11-07 NOTE — Patient Instructions (Addendum)
Continue doing brain stimulating activities (puzzles, reading, adult coloring books, staying active) to keep memory sharp.   Continue to eat heart healthy diet (full of fruits, vegetables, whole grains, lean protein, water--limit salt, fat, and sugar intake) and increase physical activity as tolerated.   Ms. Sharon Zavala , Thank you for taking time to come for your Medicare Wellness Visit. I appreciate your ongoing commitment to your health goals. Please review the following plan we discussed and let me know if I can assist you in the future.   These are the goals we discussed: Goals    . Patient Stated     Continue to eat healthy and walk as much as possible. I will continue to do the physical therapy exercises. Enjoy the beach, life, and family.        This is a list of the screening recommended for you and due dates:  Health Maintenance  Topic Date Due  . Colon Cancer Screening  02/04/2018  . Flu Shot  03/02/2018  . Tetanus Vaccine  09/18/2020  . DEXA scan (bone density measurement)  Completed  . Pneumonia vaccines  Completed      Health Maintenance, Female Adopting a healthy lifestyle and getting preventive care can go a long way to promote health and wellness. Talk with your health care provider about what schedule of regular examinations is right for you. This is a good chance for you to check in with your provider about disease prevention and staying healthy. In between checkups, there are plenty of things you can do on your own. Experts have done a lot of research about which lifestyle changes and preventive measures are most likely to keep you healthy. Ask your health care provider for more information. Weight and diet Eat a healthy diet  Be sure to include plenty of vegetables, fruits, low-fat dairy products, and lean protein.  Do not eat a lot of foods high in solid fats, added sugars, or salt.  Get regular exercise. This is one of the most important things you can do for  your health. ? Most adults should exercise for at least 150 minutes each week. The exercise should increase your heart rate and make you sweat (moderate-intensity exercise). ? Most adults should also do strengthening exercises at least twice a week. This is in addition to the moderate-intensity exercise.  Maintain a healthy weight  Body mass index (BMI) is a measurement that can be used to identify possible weight problems. It estimates body fat based on height and weight. Your health care provider can help determine your BMI and help you achieve or maintain a healthy weight.  For females 77 years of age and older: ? A BMI below 18.5 is considered underweight. ? A BMI of 18.5 to 24.9 is normal. ? A BMI of 25 to 29.9 is considered overweight. ? A BMI of 30 and above is considered obese.  Watch levels of cholesterol and blood lipids  You should start having your blood tested for lipids and cholesterol at 76 years of age, then have this test every 5 years.  You may need to have your cholesterol levels checked more often if: ? Your lipid or cholesterol levels are high. ? You are older than 76 years of age. ? You are at high risk for heart disease.  Cancer screening Lung Cancer  Lung cancer screening is recommended for adults 31-45 years old who are at high risk for lung cancer because of a history of smoking.  A yearly  low-dose CT scan of the lungs is recommended for people who: ? Currently smoke. ? Have quit within the past 15 years. ? Have at least a 30-pack-year history of smoking. A pack year is smoking an average of one pack of cigarettes a day for 1 year.  Yearly screening should continue until it has been 15 years since you quit.  Yearly screening should stop if you develop a health problem that would prevent you from having lung cancer treatment.  Breast Cancer  Practice breast self-awareness. This means understanding how your breasts normally appear and feel.  It also  means doing regular breast self-exams. Let your health care provider know about any changes, no matter how small.  If you are in your 20s or 30s, you should have a clinical breast exam (CBE) by a health care provider every 1-3 years as part of a regular health exam.  If you are 44 or older, have a CBE every year. Also consider having a breast X-ray (mammogram) every year.  If you have a family history of breast cancer, talk to your health care provider about genetic screening.  If you are at high risk for breast cancer, talk to your health care provider about having an MRI and a mammogram every year.  Breast cancer gene (BRCA) assessment is recommended for women who have family members with BRCA-related cancers. BRCA-related cancers include: ? Breast. ? Ovarian. ? Tubal. ? Peritoneal cancers.  Results of the assessment will determine the need for genetic counseling and BRCA1 and BRCA2 testing.  Cervical Cancer Your health care provider may recommend that you be screened regularly for cancer of the pelvic organs (ovaries, uterus, and vagina). This screening involves a pelvic examination, including checking for microscopic changes to the surface of your cervix (Pap test). You may be encouraged to have this screening done every 3 years, beginning at age 61.  For women ages 48-65, health care providers may recommend pelvic exams and Pap testing every 3 years, or they may recommend the Pap and pelvic exam, combined with testing for human papilloma virus (HPV), every 5 years. Some types of HPV increase your risk of cervical cancer. Testing for HPV may also be done on women of any age with unclear Pap test results.  Other health care providers may not recommend any screening for nonpregnant women who are considered low risk for pelvic cancer and who do not have symptoms. Ask your health care provider if a screening pelvic exam is right for you.  If you have had past treatment for cervical cancer or  a condition that could lead to cancer, you need Pap tests and screening for cancer for at least 20 years after your treatment. If Pap tests have been discontinued, your risk factors (such as having a new sexual partner) need to be reassessed to determine if screening should resume. Some women have medical problems that increase the chance of getting cervical cancer. In these cases, your health care provider may recommend more frequent screening and Pap tests.  Colorectal Cancer  This type of cancer can be detected and often prevented.  Routine colorectal cancer screening usually begins at 76 years of age and continues through 76 years of age.  Your health care provider may recommend screening at an earlier age if you have risk factors for colon cancer.  Your health care provider may also recommend using home test kits to check for hidden blood in the stool.  A small camera at the end of a  tube can be used to examine your colon directly (sigmoidoscopy or colonoscopy). This is done to check for the earliest forms of colorectal cancer.  Routine screening usually begins at age 19.  Direct examination of the colon should be repeated every 5-10 years through 76 years of age. However, you may need to be screened more often if early forms of precancerous polyps or small growths are found.  Skin Cancer  Check your skin from head to toe regularly.  Tell your health care provider about any new moles or changes in moles, especially if there is a change in a mole's shape or color.  Also tell your health care provider if you have a mole that is larger than the size of a pencil eraser.  Always use sunscreen. Apply sunscreen liberally and repeatedly throughout the day.  Protect yourself by wearing long sleeves, pants, a wide-brimmed hat, and sunglasses whenever you are outside.  Heart disease, diabetes, and high blood pressure  High blood pressure causes heart disease and increases the risk of  stroke. High blood pressure is more likely to develop in: ? People who have blood pressure in the high end of the normal range (130-139/85-89 mm Hg). ? People who are overweight or obese. ? People who are African American.  If you are 61-8 years of age, have your blood pressure checked every 3-5 years. If you are 64 years of age or older, have your blood pressure checked every year. You should have your blood pressure measured twice-once when you are at a hospital or clinic, and once when you are not at a hospital or clinic. Record the average of the two measurements. To check your blood pressure when you are not at a hospital or clinic, you can use: ? An automated blood pressure machine at a pharmacy. ? A home blood pressure monitor.  If you are between 54 years and 69 years old, ask your health care provider if you should take aspirin to prevent strokes.  Have regular diabetes screenings. This involves taking a blood sample to check your fasting blood sugar level. ? If you are at a normal weight and have a low risk for diabetes, have this test once every three years after 76 years of age. ? If you are overweight and have a high risk for diabetes, consider being tested at a younger age or more often. Preventing infection Hepatitis B  If you have a higher risk for hepatitis B, you should be screened for this virus. You are considered at high risk for hepatitis B if: ? You were born in a country where hepatitis B is common. Ask your health care provider which countries are considered high risk. ? Your parents were born in a high-risk country, and you have not been immunized against hepatitis B (hepatitis B vaccine). ? You have HIV or AIDS. ? You use needles to inject street drugs. ? You live with someone who has hepatitis B. ? You have had sex with someone who has hepatitis B. ? You get hemodialysis treatment. ? You take certain medicines for conditions, including cancer, organ  transplantation, and autoimmune conditions.  Hepatitis C  Blood testing is recommended for: ? Everyone born from [birth year] through 1964. ? Anyone with known risk factors for hepatitis C.  Sexually transmitted infections (STIs)  You should be screened for sexually transmitted infections (STIs) including gonorrhea and chlamydia if: ? You are sexually active and are younger than 76 years of age. ? You are older than  76 years of age and your health care provider tells you that you are at risk for this type of infection. ? Your sexual activity has changed since you were last screened and you are at an increased risk for chlamydia or gonorrhea. Ask your health care provider if you are at risk.  If you do not have HIV, but are at risk, it may be recommended that you take a prescription medicine daily to prevent HIV infection. This is called pre-exposure prophylaxis (PrEP). You are considered at risk if: ? You are sexually active and do not regularly use condoms or know the HIV status of your partner(s). ? You take drugs by injection. ? You are sexually active with a partner who has HIV.  Talk with your health care provider about whether you are at high risk of being infected with HIV. If you choose to begin PrEP, you should first be tested for HIV. You should then be tested every 3 months for as long as you are taking PrEP. Pregnancy  If you are premenopausal and you may become pregnant, ask your health care provider about preconception counseling.  If you may become pregnant, take 400 to 800 micrograms (mcg) of folic acid every day.  If you want to prevent pregnancy, talk to your health care provider about birth control (contraception). Osteoporosis and menopause  Osteoporosis is a disease in which the bones lose minerals and strength with aging. This can result in serious bone fractures. Your risk for osteoporosis can be identified using a bone density scan.  If you are 65 years of age or  older, or if you are at risk for osteoporosis and fractures, ask your health care provider if you should be screened.  Ask your health care provider whether you should take a calcium or vitamin D supplement to lower your risk for osteoporosis.  Menopause may have certain physical symptoms and risks.  Hormone replacement therapy may reduce some of these symptoms and risks. Talk to your health care provider about whether hormone replacement therapy is right for you. Follow these instructions at home:  Schedule regular health, dental, and eye exams.  Stay current with your immunizations.  Do not use any tobacco products including cigarettes, chewing tobacco, or electronic cigarettes.  If you are pregnant, do not drink alcohol.  If you are breastfeeding, limit how much and how often you drink alcohol.  Limit alcohol intake to no more than 1 drink per day for nonpregnant women. One drink equals 12 ounces of beer, 5 ounces of [place], or 1 ounces of hard liquor.  Do not use street drugs.  Do not share needles.  Ask your health care provider for help if you need support or information about quitting drugs.  Tell your health care provider if you often feel depressed.  Tell your health care provider if you have ever been abused or do not feel safe at home. This information is not intended to replace advice given to you by your health care provider. Make sure you discuss any questions you have with your health care provider. Document Released: 02/01/2011 Document Revised: 12/25/2015 Document Reviewed: 04/22/2015 Elsevier Interactive Patient Education  2018 Elsevier Inc.  

## 2017-11-07 NOTE — Assessment & Plan Note (Signed)
Will finish PT at home. Is markedly improved from condition on discharge of hospital.

## 2017-11-07 NOTE — Progress Notes (Signed)
Subjective:   Sharon Zavala is a 77 y.o. female who presents for Medicare Annual (Subsequent) preventive examination.  Review of Systems:  No ROS.  Medicare Wellness Visit. Additional risk factors are reflected in the social history.  Cardiac Risk Factors include: advanced age (>47men, >70 women);dyslipidemia;hypertension Sleep patterns: feels rested on waking, does not get up to void, gets up 1-2 times nightly to void and sleeps 6-7 hours nightly.    Home Safety/Smoke Alarms: Feels safe in home. Smoke alarms in place.  Living environment; residence and Firearm Safety: 1-story house/ trailer, no firearms., Lives with husband, no needs for DME, good support system Seat Belt Safety/Bike Helmet: Wears seat belt.      Objective:     Vitals: BP 132/70   Pulse 86   Temp 98.3 F (36.8 C)   Ht 5\' 4"  (1.626 m)   Wt 158 lb (71.7 kg)   SpO2 95%   BMI 27.12 kg/m   Body mass index is 27.12 kg/m.  Advanced Directives 11/07/2017 10/07/2017  Does Patient Have a Medical Advance Directive? No No  Would patient like information on creating a medical advance directive? No - Patient declined No - Patient declined    Tobacco Social History   Tobacco Use  Smoking Status Never Smoker  Smokeless Tobacco Never Used     Counseling given: Not Answered  Past Medical History:  Diagnosis Date  . 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 Routine general medical examination at a health care facility   . Unspecified essential hypertension    Past Surgical History:  Procedure Laterality Date  . MASTECTOMY MODIFIED RADICAL  '89 & '97   Bilateral  . TONSILLECTOMY     Family History  Problem Relation Age of Onset  . Prostate cancer Father 46       deceased  . Other Mother 76       natural causes  . Breast cancer Sister   . Colon cancer Neg Hx   . Diabetes Neg Hx   . Coronary artery disease Neg Hx    Social History   Socioeconomic History  . Marital status:  Married    Spouse name: Not on file  . Number of children: 1  . Years of education: Not on file  . Highest education level: Not on file  Occupational History  . Not on file  Social Needs  . Financial resource strain: Not hard at all  . Food insecurity:    Worry: Never true    Inability: Never true  . Transportation needs:    Medical: No    Non-medical: No  Tobacco Use  . Smoking status: Never Smoker  . Smokeless tobacco: Never Used  Substance and Sexual Activity  . Alcohol use: Yes    Comment: occ  . Drug use: No  . Sexual activity: Not Currently  Lifestyle  . Physical activity:    Days per week: 3 days    Minutes per session: 50 min  . Stress: Only a little  Relationships  . Social connections:    Talks on phone: More than three times a week    Gets together: More than three times a week    Attends religious service: More than 4 times per year    Active member of club or organization: Yes    Attends meetings of clubs or organizations: More than 4 times per year    Relationship status: Married  Other Topics Concern  . Not  on file  Social History Narrative   [place]   Married '67   1 daughter - '68; 1 grandchild   Lives with husband - independently ; he has made a good recovery from prostate surgery-'09    Outpatient Encounter Medications as of 11/07/2017  Medication Sig  . [DISCONTINUED] acyclovir (ZOVIRAX) 400 MG tablet Take 1 tablet (400 mg total) by mouth 3 (three) times daily. (Patient not taking: Reported on 10/07/2017)  . [DISCONTINUED] lisinopril-hydrochlorothiazide (PRINZIDE,ZESTORETIC) 10-12.5 MG tablet TAKE 1 TABLET BY MOUTH DAILY.   No facility-administered encounter medications on file as of 11/07/2017.     Activities of Daily Living In your present state of health, do you have any difficulty performing the following activities: 11/07/2017 10/10/2017  Hearing? N N  Vision? N N  Difficulty concentrating or making decisions? N N  Walking  or climbing stairs? N N  Dressing or bathing? N N  Doing errands, shopping? N N  Preparing Food and eating ? N -  Using the Toilet? N -  In the past six months, have you accidently leaked urine? N -  Do you have problems with loss of bowel control? N -  Managing your Medications? N -  Managing your Finances? N -  Housekeeping or managing your Housekeeping? N -  Some recent data might be hidden    Patient Care Team: Hoyt Koch, MD as PCP - General (Internal Medicine) Marylynn Pearson, MD (Obstetrics and Gynecology) Griselda Miner, MD as Consulting Physician (Dermatology) Keene Breath., MD (Ophthalmology) Eustace Moore, MD as Consulting Physician (Neurosurgery)    Assessment:   This is a routine wellness examination for Sharon Zavala. Physical assessment deferred to PCP.   Exercise Activities and Dietary recommendations Current Exercise Habits: Home exercise routine, Type of exercise: walking, Time (Minutes): 35, Frequency (Times/Week): 4, Weekly Exercise (Minutes/Week): 140, Intensity: Mild  Diet (meal preparation, eat out, water intake, caffeinated beverages, dairy products, fruits and vegetables): in general, a "healthy" diet  , well balanced, Diet education was attached to patient's AVS.  Goals    . Patient Stated     Continue to eat healthy and walk as much as possible. I will continue to do the physical therapy exercises. Enjoy the beach, life, and family.        Fall Risk Fall Risk  11/07/2017 10/02/2015  Falls in the past year? Yes No  Number falls in past yr: 1 -  Injury with Fall? Yes -  Follow up Falls prevention discussed -    Depression Screen PHQ 2/9 Scores 11/07/2017 10/02/2015  PHQ - 2 Score 0 0  PHQ- 9 Score 0 -     Cognitive Function       Ad8 score reviewed for issues:  Issues making decisions: no  Less interest in hobbies / activities: no  Repeats questions, stories (family complaining): no  Trouble using ordinary gadgets (microwave,  computer, phone):no  Forgets the month or year: no  Mismanaging finances: no  Remembering appts: no  Daily problems with thinking and/or memory: no Ad8 score is= 0  Immunization History  Administered Date(s) Administered  . Influenza Whole 05/31/2008, 05/15/2009, 05/02/2010, 06/01/2012  . Influenza, High Dose Seasonal PF 04/19/2016  . Influenza,inj,Quad PF,6+ Mos 05/16/2014  . Influenza-Unspecified 05/02/2013, 05/17/2015  . Pneumococcal Conjugate-13 09/24/2013  . Pneumococcal Polysaccharide-23 01/27/2009  . Td 09/18/2010  . Zoster 09/18/2010      Screening Tests Health Maintenance  Topic Date Due  . COLONOSCOPY  02/04/2018  . INFLUENZA  VACCINE  03/02/2018  . TETANUS/TDAP  09/18/2020  . DEXA SCAN  Completed  . PNA vac Low Risk Adult  Completed      Plan:  Continue doing brain stimulating activities (puzzles, reading, adult coloring books, staying active) to keep memory sharp.   Continue to eat heart healthy diet (full of fruits, vegetables, whole grains, lean protein, water--limit salt, fat, and sugar intake) and increase physical activity as tolerated.  I have personally reviewed and noted the following in the patient's chart:   . Medical and social history . Use of alcohol, tobacco or illicit drugs  . Current medications and supplements . Functional ability and status . Nutritional status . Physical activity . Advanced directives . List of other physicians . Vitals . Screenings to include cognitive, depression, and falls . Referrals and appointments  In addition, I have reviewed and discussed with patient certain preventive protocols, quality metrics, and best practice recommendations. A written personalized care plan for preventive services as well as general preventive health recommendations were provided to patient.     Michiel Cowboy, RN  11/07/2017

## 2017-11-07 NOTE — Assessment & Plan Note (Signed)
Refilled lisinopril/hctz. BP at goal. Checking CMP and adjust as needed.

## 2017-11-07 NOTE — Progress Notes (Signed)
   Subjective:    Patient ID: Sharon Zavala, female    DOB: [birth year], 77 y.o.   MRN: [id number]  HPI The patient is a 77 YO female coming in for CPE. New hospital stay since last time and she is still recovering and still in PT. Still feels weak from hospital stay. No neurological changes at this time.   PMH, [facility], social history reviewed and updated.   Review of Systems  Constitutional: Negative.   HENT: Negative.   Eyes: Negative.   Respiratory: Negative for cough, chest tightness and shortness of breath.   Cardiovascular: Negative for chest pain, palpitations and leg swelling.  Gastrointestinal: Negative for abdominal distention, abdominal pain, constipation, diarrhea, nausea and vomiting.  Musculoskeletal: Negative.   Skin: Negative.   Neurological: Positive for weakness. Negative for dizziness, seizures, syncope, facial asymmetry, speech difficulty and numbness.  Psychiatric/Behavioral: Negative.       Objective:   Physical Exam  Constitutional: She is oriented to person, place, and time. She appears well-developed and well-nourished.  HENT:  Head: Normocephalic and atraumatic.  Eyes: EOM are normal.  Neck: Normal range of motion.  Cardiovascular: Normal rate and regular rhythm.  Pulmonary/Chest: Effort normal and breath sounds normal. No respiratory distress. She has no wheezes. She has no rales.  Abdominal: Soft. Bowel sounds are normal. She exhibits no distension. There is no tenderness. There is no rebound.  Musculoskeletal: She exhibits no edema.  Neurological: She is alert and oriented to person, place, and time. Coordination normal.  Skin: Skin is warm and dry.  Psychiatric: She has a normal mood and affect.   Vitals:   11/07/17 1021  BP: 132/70  Pulse: 86  Temp: 98.3 F (36.8 C)  TempSrc: Oral  SpO2: 95%  Weight: 158 lb (71.7 kg)  Height: 5\' 4"  (1.626 m)      Assessment & Plan:

## 2017-11-08 ENCOUNTER — Telehealth: Payer: Self-pay | Admitting: Internal Medicine

## 2017-11-08 NOTE — Telephone Encounter (Signed)
FYI

## 2017-11-08 NOTE — Telephone Encounter (Signed)
Copied from [place] [id number]. Topic: General - Other >> Nov 08, 2017  1:04 PM Carolyn Stare wrote:  Clair Gulling with [facility] call to delay his last visit for 2 weeks  [phone]

## 2017-11-08 NOTE — Telephone Encounter (Signed)
Fine

## 2017-11-09 ENCOUNTER — Ambulatory Visit
Admission: RE | Admit: 2017-11-09 | Discharge: 2017-11-09 | Disposition: A | Discharge status: Home / Self Care | Payer: Medicare HMO | Source: Ambulatory Visit | Attending: Neurological Surgery | Admitting: Neurological Surgery

## 2017-11-09 DIAGNOSIS — S065X9A Traumatic subdural hemorrhage with loss of consciousness of unspecified duration, initial encounter: Secondary | ICD-10-CM

## 2017-11-09 DIAGNOSIS — S02119A Unspecified fracture of occiput, initial encounter for closed fracture: Secondary | ICD-10-CM | POA: Diagnosis not present

## 2017-11-09 DIAGNOSIS — S065XAA Traumatic subdural hemorrhage with loss of consciousness status unknown, initial encounter: Secondary | ICD-10-CM

## 2017-11-22 DIAGNOSIS — I10 Essential (primary) hypertension: Secondary | ICD-10-CM | POA: Diagnosis not present

## 2017-11-22 DIAGNOSIS — S065X9A Traumatic subdural hemorrhage with loss of consciousness of unspecified duration, initial encounter: Secondary | ICD-10-CM | POA: Diagnosis not present

## 2017-11-22 DIAGNOSIS — Z6829 Body mass index (BMI) 29.0-29.9, adult: Secondary | ICD-10-CM | POA: Diagnosis not present

## 2017-11-24 DIAGNOSIS — I10 Essential (primary) hypertension: Secondary | ICD-10-CM | POA: Diagnosis not present

## 2017-11-24 DIAGNOSIS — Z9013 Acquired absence of bilateral breasts and nipples: Secondary | ICD-10-CM | POA: Diagnosis not present

## 2017-11-24 DIAGNOSIS — W108XXD Fall (on) (from) other stairs and steps, subsequent encounter: Secondary | ICD-10-CM | POA: Diagnosis not present

## 2017-11-24 DIAGNOSIS — Z853 Personal history of malignant neoplasm of breast: Secondary | ICD-10-CM | POA: Diagnosis not present

## 2017-11-24 DIAGNOSIS — S065X9D Traumatic subdural hemorrhage with loss of consciousness of unspecified duration, subsequent encounter: Secondary | ICD-10-CM | POA: Diagnosis not present

## 2018-01-19 ENCOUNTER — Encounter: Payer: Self-pay | Admitting: Gastroenterology

## 2018-05-25 DIAGNOSIS — R69 Illness, unspecified: Secondary | ICD-10-CM | POA: Diagnosis not present

## 2018-06-22 DIAGNOSIS — Z9181 History of falling: Secondary | ICD-10-CM | POA: Diagnosis not present

## 2018-06-22 DIAGNOSIS — Z87891 Personal history of nicotine dependence: Secondary | ICD-10-CM | POA: Diagnosis not present

## 2018-06-22 DIAGNOSIS — Z8249 Family history of ischemic heart disease and other diseases of the circulatory system: Secondary | ICD-10-CM | POA: Diagnosis not present

## 2018-06-22 DIAGNOSIS — Z809 Family history of malignant neoplasm, unspecified: Secondary | ICD-10-CM | POA: Diagnosis not present

## 2018-06-22 DIAGNOSIS — Z7722 Contact with and (suspected) exposure to environmental tobacco smoke (acute) (chronic): Secondary | ICD-10-CM | POA: Diagnosis not present

## 2018-06-22 DIAGNOSIS — Z853 Personal history of malignant neoplasm of breast: Secondary | ICD-10-CM | POA: Diagnosis not present

## 2018-06-22 DIAGNOSIS — Z85828 Personal history of other malignant neoplasm of skin: Secondary | ICD-10-CM | POA: Diagnosis not present

## 2018-06-22 DIAGNOSIS — Z803 Family history of malignant neoplasm of breast: Secondary | ICD-10-CM | POA: Diagnosis not present

## 2018-06-22 DIAGNOSIS — I10 Essential (primary) hypertension: Secondary | ICD-10-CM | POA: Diagnosis not present

## 2018-06-22 DIAGNOSIS — Z008 Encounter for other general examination: Secondary | ICD-10-CM | POA: Diagnosis not present

## 2018-06-22 DIAGNOSIS — Z833 Family history of diabetes mellitus: Secondary | ICD-10-CM | POA: Diagnosis not present

## 2018-08-03 DIAGNOSIS — D2361 Other benign neoplasm of skin of right upper limb, including shoulder: Secondary | ICD-10-CM | POA: Diagnosis not present

## 2018-08-03 DIAGNOSIS — L821 Other seborrheic keratosis: Secondary | ICD-10-CM | POA: Diagnosis not present

## 2018-08-03 DIAGNOSIS — L814 Other melanin hyperpigmentation: Secondary | ICD-10-CM | POA: Diagnosis not present

## 2018-08-03 DIAGNOSIS — Z85828 Personal history of other malignant neoplasm of skin: Secondary | ICD-10-CM | POA: Diagnosis not present

## 2018-08-03 DIAGNOSIS — L570 Actinic keratosis: Secondary | ICD-10-CM | POA: Diagnosis not present

## 2018-08-03 DIAGNOSIS — D1801 Hemangioma of skin and subcutaneous tissue: Secondary | ICD-10-CM | POA: Diagnosis not present

## 2018-10-10 ENCOUNTER — Other Ambulatory Visit (INDEPENDENT_AMBULATORY_CARE_PROVIDER_SITE_OTHER): Payer: Medicare HMO

## 2018-10-10 ENCOUNTER — Ambulatory Visit (INDEPENDENT_AMBULATORY_CARE_PROVIDER_SITE_OTHER): Payer: Medicare HMO | Admitting: Internal Medicine

## 2018-10-10 ENCOUNTER — Encounter: Payer: Self-pay | Admitting: Internal Medicine

## 2018-10-10 VITALS — BP 130/80 | HR 83 | Temp 98.2°F | Ht 64.0 in | Wt 166.0 lb

## 2018-10-10 DIAGNOSIS — Z0000 Encounter for general adult medical examination without abnormal findings: Secondary | ICD-10-CM

## 2018-10-10 DIAGNOSIS — I10 Essential (primary) hypertension: Secondary | ICD-10-CM | POA: Diagnosis not present

## 2018-10-10 LAB — CBC
HCT: 46 % (ref 36.0–46.0)
Hemoglobin: 15.6 g/dL — ABNORMAL HIGH (ref 12.0–15.0)
MCHC: 33.9 g/dL (ref 30.0–36.0)
MCV: 94.6 fl (ref 78.0–100.0)
Platelets: 344 10*3/uL (ref 150.0–400.0)
RBC: 4.86 Mil/uL (ref 3.87–5.11)
RDW: 13.7 % (ref 11.5–15.5)
WBC: 6.4 10*3/uL (ref 4.0–10.5)

## 2018-10-10 LAB — LIPID PANEL
Cholesterol: 213 mg/dL — ABNORMAL HIGH (ref 0–200)
HDL: 67.4 mg/dL (ref >39.00)
LDL Cholesterol: 120 mg/dL — ABNORMAL HIGH (ref 0–99)
NonHDL: 146.09
Total CHOL/HDL Ratio: 3
Triglycerides: 128 mg/dL (ref 0.0–149.0)
VLDL: 25.6 mg/dL (ref 0.0–40.0)

## 2018-10-10 LAB — COMPREHENSIVE METABOLIC PANEL
ALT: 12 U/L (ref 0–35)
AST: 13 U/L (ref 0–37)
Albumin: 4.2 g/dL (ref 3.5–5.2)
Alkaline Phosphatase: 98 U/L (ref 39–117)
BUN: 16 mg/dL (ref 6–23)
CO2: 28 mEq/L (ref 19–32)
Calcium: 10.3 mg/dL (ref 8.4–10.5)
Chloride: 106 mEq/L (ref 96–112)
Creatinine, Ser: 0.85 mg/dL (ref 0.40–1.20)
GFR: 64.97 mL/min (ref >60.00)
Glucose, Bld: 102 mg/dL — ABNORMAL HIGH (ref 70–99)
Potassium: 5.1 mEq/L (ref 3.5–5.1)
Sodium: 143 mEq/L (ref 135–145)
Total Bilirubin: 0.6 mg/dL (ref 0.2–1.2)
Total Protein: 7 g/dL (ref 6.0–8.3)

## 2018-10-10 LAB — TSH: TSH: 1.52 u[IU]/mL (ref 0.35–4.50)

## 2018-10-10 LAB — T4, FREE: Free T4: 1.08 ng/dL (ref 0.60–1.60)

## 2018-10-10 LAB — VITAMIN B12: Vitamin B-12: 614 pg/mL (ref 211–911)

## 2018-10-10 LAB — VITAMIN D 25 HYDROXY (VIT D DEFICIENCY, FRACTURES): VITD: 44.56 ng/mL (ref 30.00–100.00)

## 2018-10-10 MED ORDER — ZOSTER VAC RECOMB ADJUVANTED 50 MCG/0.5ML IM SUSR: 0.5000 mL | Freq: Once | INTRAMUSCULAR | 1 refills | Status: AC

## 2018-10-10 MED ORDER — LISINOPRIL-HYDROCHLOROTHIAZIDE 10-12.5 MG PO TABS: 1.0000 | ORAL_TABLET | Freq: Every day | ORAL | 3 refills | Status: DC

## 2018-10-10 NOTE — Progress Notes (Signed)
   Subjective:   Patient ID: Sharon Zavala, female    DOB: 08/25/1940, 78 y.o.   MRN: [id number]  HPI The patient is a 78 YO female coming in for physical.   PMH, [place], social history reviewed and updated  Review of Systems  Constitutional: Positive for fatigue. Negative for activity change, appetite change, fever and unexpected weight change.  HENT: Negative.   Eyes: Negative.   Respiratory: Negative for cough, chest tightness and shortness of breath.   Cardiovascular: Negative for chest pain, palpitations and leg swelling.  Gastrointestinal: Negative for abdominal distention, abdominal pain, constipation, diarrhea, nausea and vomiting.  Musculoskeletal: Negative.   Skin: Negative.   Neurological: Negative.   Psychiatric/Behavioral: Negative.     Objective:  Physical Exam Constitutional:      Appearance: She is well-developed.  HENT:     Head: Normocephalic and atraumatic.  Neck:     Musculoskeletal: Normal range of motion.  Cardiovascular:     Rate and Rhythm: Normal rate and regular rhythm.  Pulmonary:     Effort: Pulmonary effort is normal. No respiratory distress.     Breath sounds: Normal breath sounds. No wheezing or rales.  Abdominal:     General: Bowel sounds are normal. There is no distension.     Palpations: Abdomen is soft.     Tenderness: There is no abdominal tenderness. There is no rebound.  Skin:    General: Skin is warm and dry.  Neurological:     Mental Status: She is alert and oriented to person, place, and time.     Coordination: Coordination normal.     Vitals:   10/10/18 1321  BP: 130/80  Pulse: 83  Temp: 98.2 F (36.8 C)  TempSrc: Oral  SpO2: 97%  Weight: 166 lb (75.3 kg)  Height: 5\' 4"  (1.626 m)    Assessment & Plan:

## 2018-10-10 NOTE — Assessment & Plan Note (Signed)
Flu shot up to date. Pneumonia complete. Shingrix given rx. Tetanus up to date. Colonoscopy up to date. Mammogram up to date, pap smear aged out and dexa up to date. Counseled about sun safety and mole surveillance. Counseled about the dangers of distracted driving. Given 10 year screening recommendations.

## 2018-10-10 NOTE — Assessment & Plan Note (Signed)
BP at goal on lisinopril/hctz 10/12.5 and checking CMP and adjust as needed. Mild cough which could be related but not bothersome.

## 2018-10-10 NOTE — Patient Instructions (Signed)

## 2018-12-09 IMAGING — CR DG LUMBAR SPINE COMPLETE 4+V
5 series · 5 of 5 positions shown · non-contrast
Comparison: None.

CLINICAL DATA: Low back pain, fall couple of days ago.

EXAM:
LUMBAR SPINE - COMPLETE 4+ VIEW

[l-spine ap]
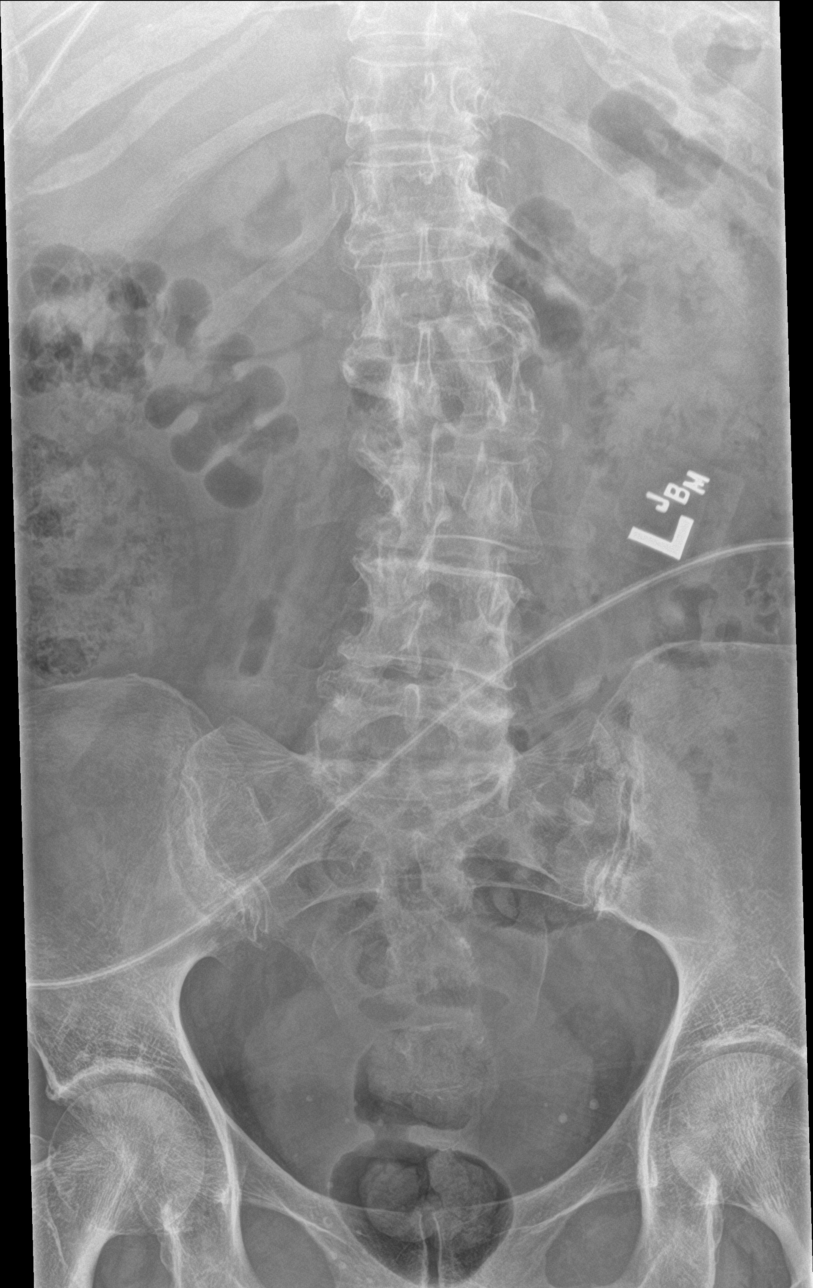

[l-spine obl (1 of 2)]
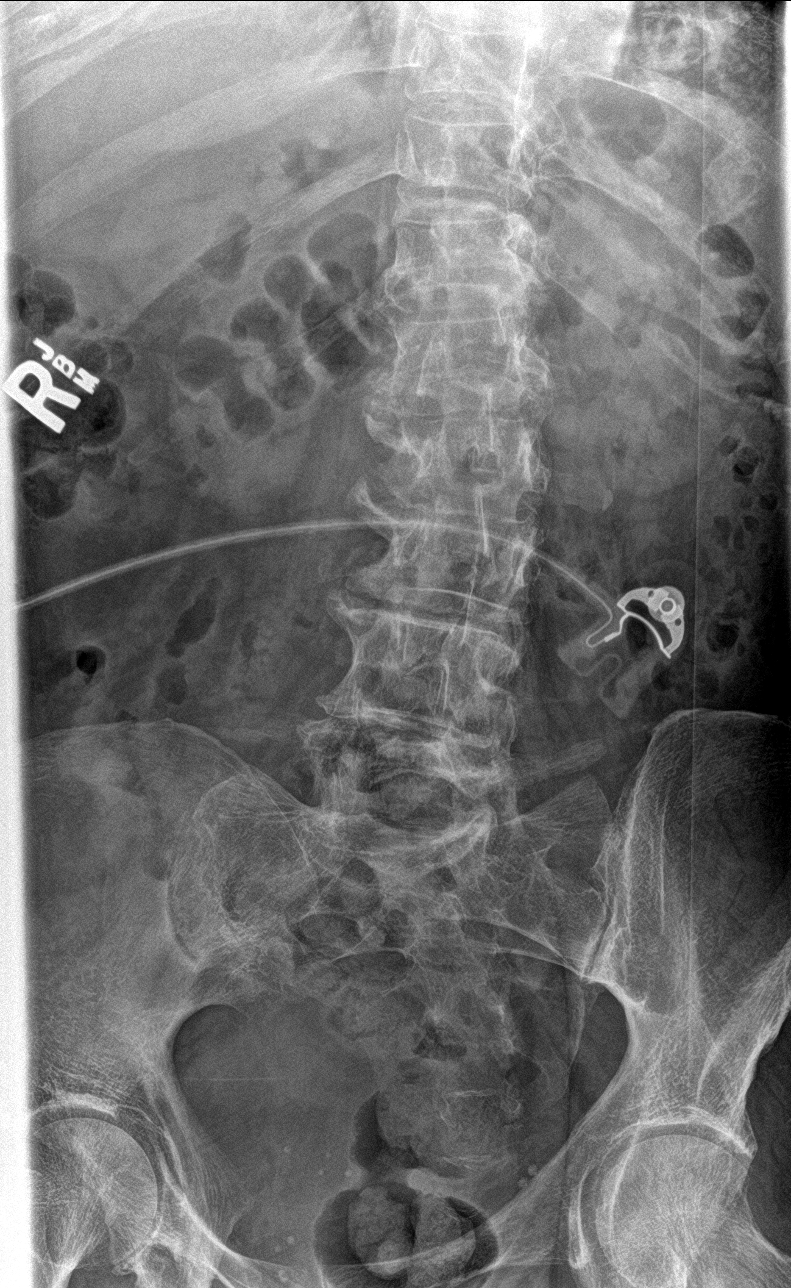

[l-spine obl (2 of 2)]
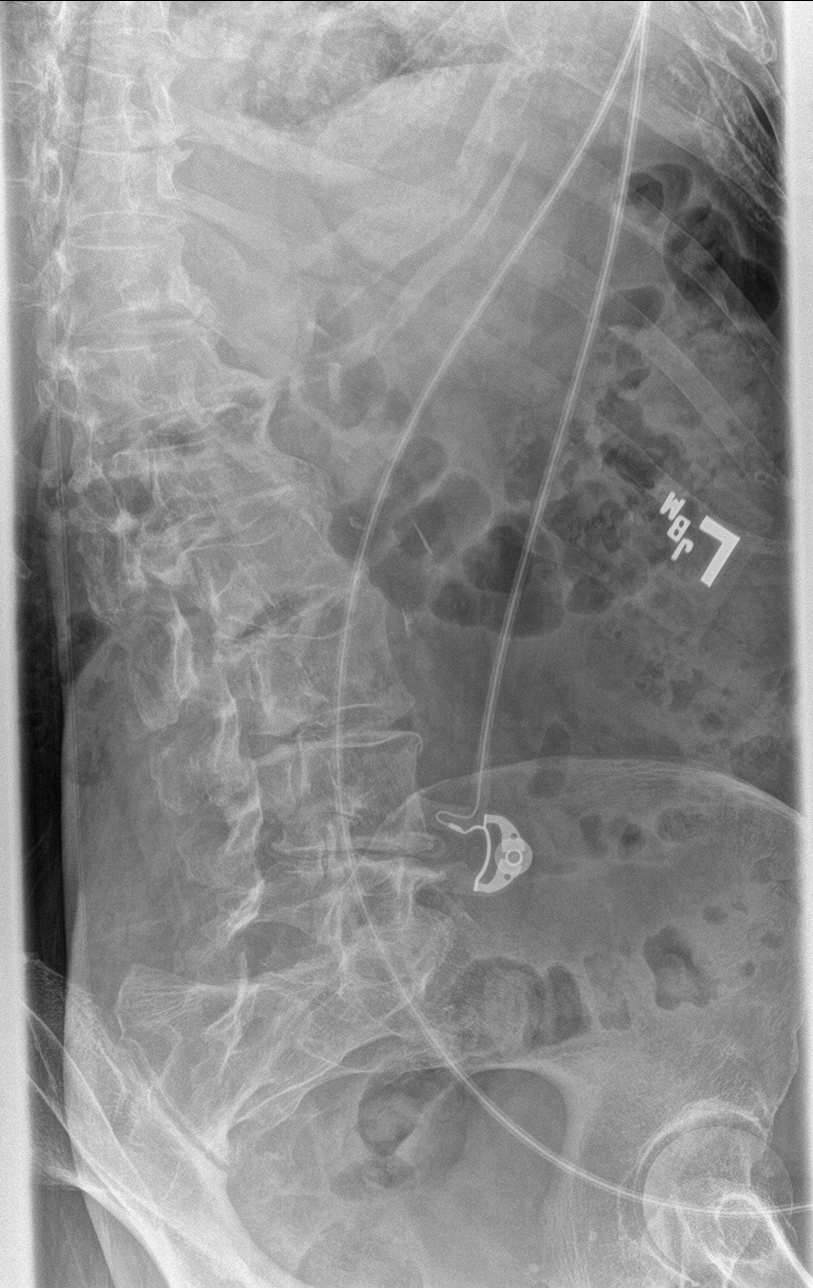

[l-spine lat]
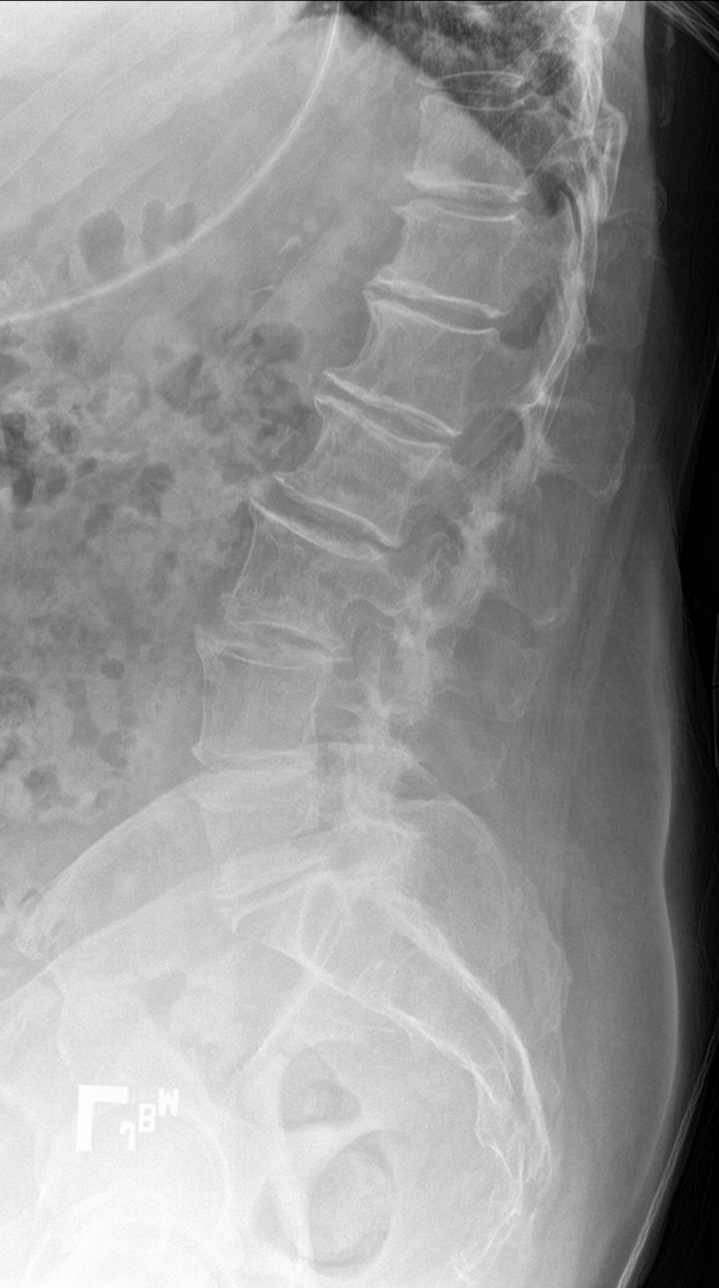

[l-spine spot]
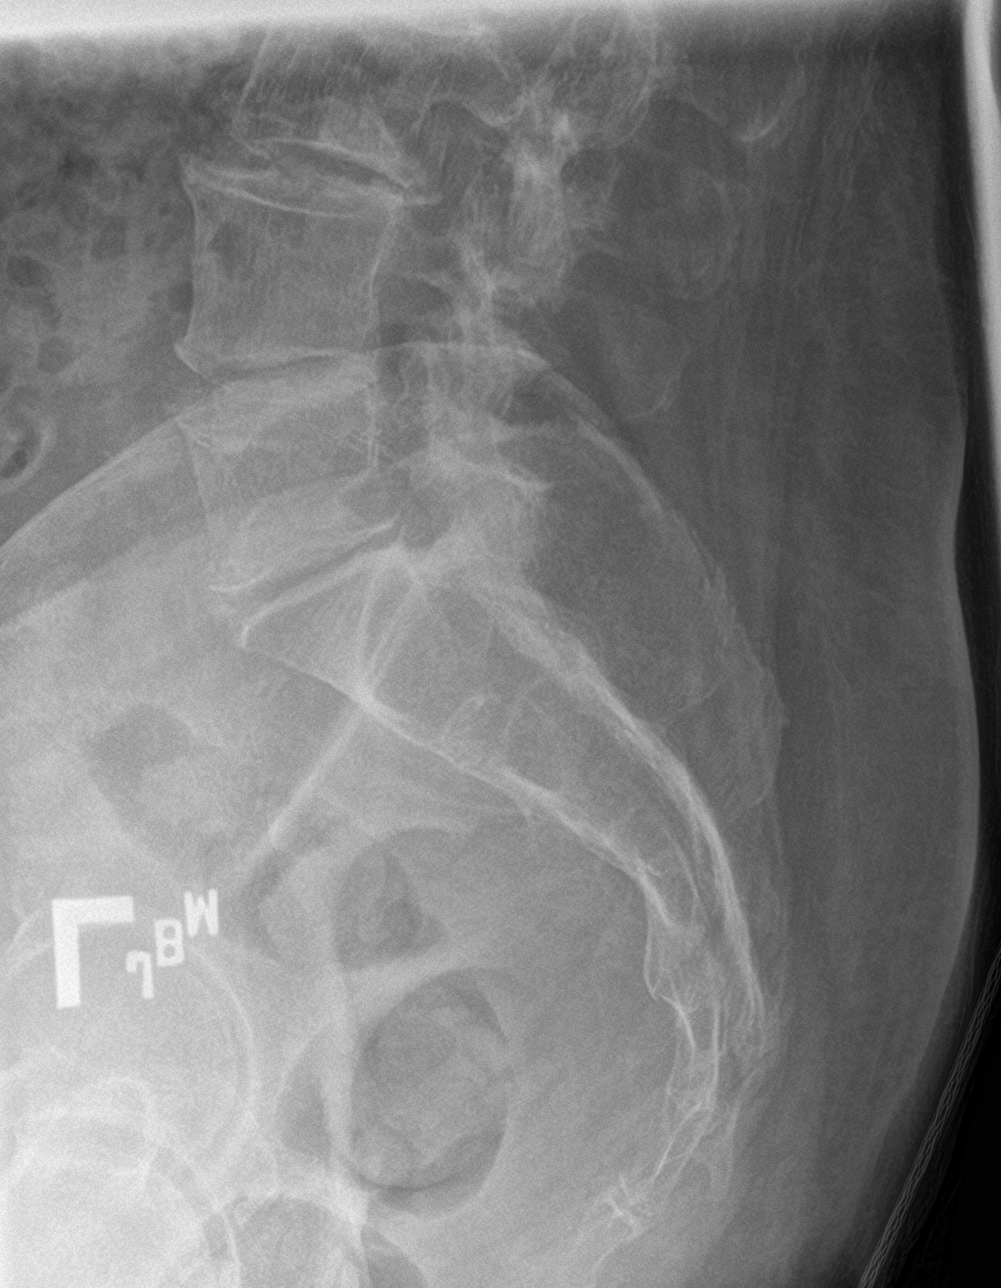

[5 of 5 positions shown; findings below may reference images not displayed]

FINDINGS: Dextroscoliosis, moderate in degree, centered at the L2-L3 level. No
fracture line or displaced fracture fragment identified. No
compression fracture deformity.

Degenerative changes are seen throughout the lumbar spine, at least
moderate in degree, with associated disc space narrowing and osseous
spurring. Degenerative facet hypertrophy is also present from L2
through S1 with likely some degree of associated osseous neural
foramen encroachment. Upper sacrum appears intact and normally
aligned. Visualized paravertebral soft tissues are unremarkable.
IMPRESSION: 1. Degenerative changes, at least moderate in degree, as detailed
above. If any radiculopathic symptoms, would consider nonemergent
lumbar spine MRI to exclude associated nerve root impingement.
2. Scoliosis.
3. No acute findings.

## 2019-05-01 DIAGNOSIS — R69 Illness, unspecified: Secondary | ICD-10-CM | POA: Diagnosis not present

## 2019-06-21 DIAGNOSIS — Z803 Family history of malignant neoplasm of breast: Secondary | ICD-10-CM | POA: Diagnosis not present

## 2019-06-21 DIAGNOSIS — Z823 Family history of stroke: Secondary | ICD-10-CM | POA: Diagnosis not present

## 2019-06-21 DIAGNOSIS — Z6830 Body mass index (BMI) 30.0-30.9, adult: Secondary | ICD-10-CM | POA: Diagnosis not present

## 2019-06-21 DIAGNOSIS — Z8249 Family history of ischemic heart disease and other diseases of the circulatory system: Secondary | ICD-10-CM | POA: Diagnosis not present

## 2019-06-21 DIAGNOSIS — I10 Essential (primary) hypertension: Secondary | ICD-10-CM | POA: Diagnosis not present

## 2019-06-21 DIAGNOSIS — Z809 Family history of malignant neoplasm, unspecified: Secondary | ICD-10-CM | POA: Diagnosis not present

## 2019-06-21 DIAGNOSIS — E669 Obesity, unspecified: Secondary | ICD-10-CM | POA: Diagnosis not present

## 2019-06-21 DIAGNOSIS — Z7722 Contact with and (suspected) exposure to environmental tobacco smoke (acute) (chronic): Secondary | ICD-10-CM | POA: Diagnosis not present

## 2019-06-21 DIAGNOSIS — Z833 Family history of diabetes mellitus: Secondary | ICD-10-CM | POA: Diagnosis not present

## 2019-06-21 DIAGNOSIS — Z008 Encounter for other general examination: Secondary | ICD-10-CM | POA: Diagnosis not present

## 2019-06-21 DIAGNOSIS — Z853 Personal history of malignant neoplasm of breast: Secondary | ICD-10-CM | POA: Diagnosis not present

## 2019-08-09 DIAGNOSIS — D1801 Hemangioma of skin and subcutaneous tissue: Secondary | ICD-10-CM | POA: Diagnosis not present

## 2019-08-09 DIAGNOSIS — L720 Epidermal cyst: Secondary | ICD-10-CM | POA: Diagnosis not present

## 2019-08-09 DIAGNOSIS — D225 Melanocytic nevi of trunk: Secondary | ICD-10-CM | POA: Diagnosis not present

## 2019-08-09 DIAGNOSIS — L814 Other melanin hyperpigmentation: Secondary | ICD-10-CM | POA: Diagnosis not present

## 2019-08-09 DIAGNOSIS — Z85828 Personal history of other malignant neoplasm of skin: Secondary | ICD-10-CM | POA: Diagnosis not present

## 2019-08-09 DIAGNOSIS — L821 Other seborrheic keratosis: Secondary | ICD-10-CM | POA: Diagnosis not present

## 2019-08-09 DIAGNOSIS — D235 Other benign neoplasm of skin of trunk: Secondary | ICD-10-CM | POA: Diagnosis not present

## 2019-08-09 DIAGNOSIS — D2361 Other benign neoplasm of skin of right upper limb, including shoulder: Secondary | ICD-10-CM | POA: Diagnosis not present

## 2019-08-09 DIAGNOSIS — L570 Actinic keratosis: Secondary | ICD-10-CM | POA: Diagnosis not present

## 2019-10-12 ENCOUNTER — Ambulatory Visit (INDEPENDENT_AMBULATORY_CARE_PROVIDER_SITE_OTHER): Payer: Medicare HMO | Admitting: Internal Medicine

## 2019-10-12 ENCOUNTER — Other Ambulatory Visit: Payer: Self-pay

## 2019-10-12 ENCOUNTER — Encounter: Payer: Self-pay | Admitting: Internal Medicine

## 2019-10-12 VITALS — BP 138/92 | HR 100 | Temp 98.6°F | Ht 64.0 in | Wt 166.2 lb

## 2019-10-12 DIAGNOSIS — I10 Essential (primary) hypertension: Secondary | ICD-10-CM | POA: Diagnosis not present

## 2019-10-12 DIAGNOSIS — Z0000 Encounter for general adult medical examination without abnormal findings: Secondary | ICD-10-CM

## 2019-10-12 NOTE — Assessment & Plan Note (Signed)
Flu shot up to date. Covid-19 series complete. Pneumonia complete. Shingrix 1st complete awaiting second. Tetanus due 2022. Colonoscopy aged out prior to recall. Mammogram not indicated, pap smear aged out and dexa due declines today. Counseled about sun safety and mole surveillance. Counseled about the dangers of distracted driving. Given 10 year screening recommendations.

## 2019-10-12 NOTE — Assessment & Plan Note (Signed)
Checking CMP and adjust lisinopril/hctz as needed. BP at goal at visit.

## 2019-10-12 NOTE — Patient Instructions (Signed)
At some time we should do a bone density test.    Health Maintenance, Female Adopting a healthy lifestyle and getting preventive care are important in promoting health and wellness. Ask your health care provider about:  The right schedule for you to have regular tests and exams.  Things you can do on your own to prevent diseases and keep yourself healthy. What should I know about diet, weight, and exercise? Eat a healthy diet   Eat a diet that includes plenty of vegetables, fruits, low-fat dairy products, and lean protein.  Do not eat a lot of foods that are high in solid fats, added sugars, or sodium. Maintain a healthy weight Body mass index (BMI) is used to identify weight problems. It estimates body fat based on height and weight. Your health care provider can help determine your BMI and help you achieve or maintain a healthy weight. Get regular exercise Get regular exercise. This is one of the most important things you can do for your health. Most adults should:  Exercise for at least 150 minutes each week. The exercise should increase your heart rate and make you sweat (moderate-intensity exercise).  Do strengthening exercises at least twice a week. This is in addition to the moderate-intensity exercise.  Spend less time sitting. Even light physical activity can be beneficial. Watch cholesterol and blood lipids Have your blood tested for lipids and cholesterol at 78 years of age, then have this test every 5 years. Have your cholesterol levels checked more often if:  Your lipid or cholesterol levels are high.  You are older than 78 years of age.  You are at high risk for heart disease. What should I know about cancer screening? Depending on your health history and family history, you may need to have cancer screening at various ages. This may include screening for:  Breast cancer.  Cervical cancer.  Colorectal cancer.  Skin cancer.  Lung cancer. What should I know  about heart disease, diabetes, and high blood pressure? Blood pressure and heart disease  High blood pressure causes heart disease and increases the risk of stroke. This is more likely to develop in people who have high blood pressure readings, are of African descent, or are overweight.  Have your blood pressure checked: ? Every 3-5 years if you are 73-52 years of age. ? Every year if you are 62 years old or older. Diabetes Have regular diabetes screenings. This checks your fasting blood sugar level. Have the screening done:  Once every three years after age 83 if you are at a normal weight and have a low risk for diabetes.  More often and at a younger age if you are overweight or have a high risk for diabetes. What should I know about preventing infection? Hepatitis B If you have a higher risk for hepatitis B, you should be screened for this virus. Talk with your health care provider to find out if you are at risk for hepatitis B infection. Hepatitis C Testing is recommended for:  Everyone born from [birth year] through 1965.  Anyone with known risk factors for hepatitis C. Sexually transmitted infections (STIs)  Get screened for STIs, including gonorrhea and chlamydia, if: ? You are sexually active and are younger than 77 years of age. ? You are older than 78 years of age and your health care provider tells you that you are at risk for this type of infection. ? Your sexual activity has changed since you were last screened, and you are  at increased risk for chlamydia or gonorrhea. Ask your health care provider if you are at risk.  Ask your health care provider about whether you are at high risk for HIV. Your health care provider may recommend a prescription medicine to help prevent HIV infection. If you choose to take medicine to prevent HIV, you should first get tested for HIV. You should then be tested every 3 months for as long as you are taking the medicine. Pregnancy  If you are about  to stop having your period (premenopausal) and you may become pregnant, seek counseling before you get pregnant.  Take 400 to 800 micrograms (mcg) of folic acid every day if you become pregnant.  Ask for birth control (contraception) if you want to prevent pregnancy. Osteoporosis and menopause Osteoporosis is a disease in which the bones lose minerals and strength with aging. This can result in bone fractures. If you are 59 years old or older, or if you are at risk for osteoporosis and fractures, ask your health care provider if you should:  Be screened for bone loss.  Take a calcium or vitamin D supplement to lower your risk of fractures.  Be given hormone replacement therapy (HRT) to treat symptoms of menopause. Follow these instructions at home: Lifestyle  Do not use any products that contain nicotine or tobacco, such as cigarettes, e-cigarettes, and chewing tobacco. If you need help quitting, ask your health care provider.  Do not use street drugs.  Do not share needles.  Ask your health care provider for help if you need support or information about quitting drugs. Alcohol use  Do not drink alcohol if: ? Your health care provider tells you not to drink. ? You are pregnant, may be pregnant, or are planning to become pregnant.  If you drink alcohol: ? Limit how much you use to 0-1 drink a day. ? Limit intake if you are breastfeeding.  Be aware of how much alcohol is in your drink. In the [place] one drink equals one 12 oz bottle of beer (355 mL), one 5 oz glass of wine (148 mL), or one 1 oz glass of hard liquor (44 mL). General instructions  Schedule regular health, dental, and eye exams.  Stay current with your vaccines.  Tell your health care provider if: ? You often feel depressed. ? You have ever been abused or do not feel safe at home. Summary  Adopting a healthy lifestyle and getting preventive care are important in promoting health and wellness.  Follow your  health care provider's instructions about healthy diet, exercising, and getting tested or screened for diseases.  Follow your health care provider's instructions on monitoring your cholesterol and blood pressure. This information is not intended to replace advice given to you by your health care provider. Make sure you discuss any questions you have with your health care provider. Document Revised: 07/12/2018 Document Reviewed: 07/12/2018 Elsevier Patient Education  2020 [employer].

## 2019-10-12 NOTE — Progress Notes (Signed)
Subjective:   Patient ID: Sharon Zavala, female    DOB: 1941/07/29, 79 y.o.   MRN: [id number]  HPI Here for medicare wellness and physical, no new complaints. Please see A/P for status and treatment of chronic medical problems.   Diet: heart healthy  Physical activity: sedentary Depression/mood screen: negative Hearing: intact to whispered voice Visual acuity: grossly normal, performs annual eye exam  ADLs: capable Fall risk: none Home safety: good Cognitive evaluation: intact to orientation, naming, recall and repetition EOL planning: adv directives discussed    Office Visit from 10/12/2019 in [place] at [facility] Total Score  0        Clinical Support from 11/07/2017 in [place]  PHQ-9 Total Score  0     I have personally reviewed and have noted 1. The patient's medical and social history - reviewed today no changes 2. Their use of alcohol, tobacco or illicit drugs 3. Their current medications and supplements 4. The patient's functional ability including ADL's, fall risks, home safety risks and hearing or visual impairment. 5. Diet and physical activities 6. Evidence for depression or mood disorders 7. Care team reviewed and updated  Patient Care Team: Hoyt Koch, MD as PCP - General (Internal Medicine) Marylynn Pearson, MD (Obstetrics and Gynecology) Griselda Miner, MD as Consulting Physician (Dermatology) Keene Breath., MD (Ophthalmology) Eustace Moore, MD as Consulting Physician (Neurosurgery) Past Medical History:  Diagnosis Date  . 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 Routine general medical examination at a health care facility   . Unspecified essential hypertension    Past Surgical History:  Procedure Laterality Date  . MASTECTOMY MODIFIED RADICAL  '89 & '97   Bilateral  . TONSILLECTOMY     Family History  Problem Relation Age of Onset  . Prostate cancer Father  31       deceased  . Other Mother 11       natural causes  . Breast cancer Sister   . Colon cancer Neg Hx   . Diabetes Neg Hx   . Coronary artery disease Neg Hx     Review of Systems  Constitutional: Negative.   HENT: Negative.   Eyes: Negative.   Respiratory: Negative for cough, chest tightness and shortness of breath.   Cardiovascular: Negative for chest pain, palpitations and leg swelling.  Gastrointestinal: Negative for abdominal distention, abdominal pain, constipation, diarrhea, nausea and vomiting.  Musculoskeletal: Negative.   Skin: Negative.   Neurological: Negative.   Psychiatric/Behavioral: Negative.     Objective:  Physical Exam Constitutional:      Appearance: She is well-developed.  HENT:     Head: Normocephalic and atraumatic.  Cardiovascular:     Rate and Rhythm: Normal rate and regular rhythm.  Pulmonary:     Effort: Pulmonary effort is normal. No respiratory distress.     Breath sounds: Normal breath sounds. No wheezing or rales.  Abdominal:     General: Bowel sounds are normal. There is no distension.     Palpations: Abdomen is soft.     Tenderness: There is no abdominal tenderness. There is no rebound.  Musculoskeletal:     Cervical back: Normal range of motion.  Skin:    General: Skin is warm and dry.  Neurological:     Mental Status: She is alert and oriented to person, place, and time.     Coordination: Coordination normal.     Vitals:   10/12/19  1313  BP: (!) 138/92  Pulse: 100  Temp: 98.6 F (37 C)  TempSrc: Oral  SpO2: 98%  Weight: 166 lb 4 oz (75.4 kg)  Height: 5\' 4"  (1.626 m)    This visit occurred during the SARS-CoV-2 public health emergency.  Safety protocols were in place, including screening questions prior to the visit, additional usage of staff PPE, and extensive cleaning of exam room while observing appropriate contact time as indicated for disinfecting solutions.   Assessment & Plan:

## 2019-10-16 ENCOUNTER — Telehealth: Payer: Self-pay | Admitting: *Deleted

## 2019-10-16 DIAGNOSIS — I10 Essential (primary) hypertension: Secondary | ICD-10-CM

## 2019-10-16 NOTE — Telephone Encounter (Signed)
I called pt as we were unable to obtain her lab specimens on 10/12/19. [place] is [place] the beach for [place] 2 [place] and will call back to schedule a lab visit after she returns. Labs re-ordered.

## 2019-11-07 ENCOUNTER — Other Ambulatory Visit: Payer: Self-pay

## 2019-11-07 ENCOUNTER — Other Ambulatory Visit (INDEPENDENT_AMBULATORY_CARE_PROVIDER_SITE_OTHER): Payer: Medicare HMO

## 2019-11-07 DIAGNOSIS — I10 Essential (primary) hypertension: Secondary | ICD-10-CM | POA: Diagnosis not present

## 2019-11-07 LAB — COMPREHENSIVE METABOLIC PANEL
ALT: 14 U/L (ref 0–35)
AST: 14 U/L (ref 0–37)
Albumin: 4 g/dL (ref 3.5–5.2)
Alkaline Phosphatase: 92 U/L (ref 39–117)
BUN: 17 mg/dL (ref 6–23)
CO2: 29 mEq/L (ref 19–32)
Calcium: 9.6 mg/dL (ref 8.4–10.5)
Chloride: 102 mEq/L (ref 96–112)
Creatinine, Ser: 0.88 mg/dL (ref 0.40–1.20)
GFR: 62.25 mL/min (ref >60.00)
Glucose, Bld: 99 mg/dL (ref 70–99)
Potassium: 3.6 mEq/L (ref 3.5–5.1)
Sodium: 138 mEq/L (ref 135–145)
Total Bilirubin: 0.8 mg/dL (ref 0.2–1.2)
Total Protein: 6.6 g/dL (ref 6.0–8.3)

## 2019-11-07 LAB — CBC WITH DIFFERENTIAL/PLATELET
Basophils Absolute: 0 10*3/uL (ref 0.0–0.1)
Basophils Relative: 0.4 % (ref 0.0–3.0)
Eosinophils Absolute: 0 10*3/uL (ref 0.0–0.7)
Eosinophils Relative: 0.1 % (ref 0.0–5.0)
HCT: 42.9 % (ref 36.0–46.0)
Hemoglobin: 14.7 g/dL (ref 12.0–15.0)
Lymphocytes Relative: 35.3 % (ref 12.0–46.0)
Lymphs Abs: 2.1 10*3/uL (ref 0.7–4.0)
MCHC: 34.2 g/dL (ref 30.0–36.0)
MCV: 94.7 fl (ref 78.0–100.0)
Monocytes Absolute: 0.5 10*3/uL (ref 0.1–1.0)
Monocytes Relative: 8 % (ref 3.0–12.0)
Neutro Abs: 3.4 10*3/uL (ref 1.4–7.7)
Neutrophils Relative %: 56.2 % (ref 43.0–77.0)
Platelets: 322 10*3/uL (ref 150.0–400.0)
RBC: 4.53 Mil/uL (ref 3.87–5.11)
RDW: 13.8 % (ref 11.5–15.5)
WBC: 6 10*3/uL (ref 4.0–10.5)

## 2019-11-07 LAB — LIPID PANEL
Cholesterol: 199 mg/dL (ref 0–200)
HDL: 63.3 mg/dL (ref >39.00)
LDL Cholesterol: 117 mg/dL — ABNORMAL HIGH (ref 0–99)
NonHDL: 135.4
Total CHOL/HDL Ratio: 3
Triglycerides: 90 mg/dL (ref 0.0–149.0)
VLDL: 18 mg/dL (ref 0.0–40.0)

## 2019-12-13 ENCOUNTER — Other Ambulatory Visit: Payer: Self-pay | Admitting: Internal Medicine

## 2020-07-29 DIAGNOSIS — R69 Illness, unspecified: Secondary | ICD-10-CM | POA: Diagnosis not present

## 2020-08-27 DIAGNOSIS — Z85828 Personal history of other malignant neoplasm of skin: Secondary | ICD-10-CM | POA: Diagnosis not present

## 2020-08-27 DIAGNOSIS — D1801 Hemangioma of skin and subcutaneous tissue: Secondary | ICD-10-CM | POA: Diagnosis not present

## 2020-08-27 DIAGNOSIS — L821 Other seborrheic keratosis: Secondary | ICD-10-CM | POA: Diagnosis not present

## 2020-08-27 DIAGNOSIS — L570 Actinic keratosis: Secondary | ICD-10-CM | POA: Diagnosis not present

## 2020-08-27 DIAGNOSIS — D225 Melanocytic nevi of trunk: Secondary | ICD-10-CM | POA: Diagnosis not present

## 2020-08-27 DIAGNOSIS — L814 Other melanin hyperpigmentation: Secondary | ICD-10-CM | POA: Diagnosis not present

## 2020-08-27 DIAGNOSIS — D235 Other benign neoplasm of skin of trunk: Secondary | ICD-10-CM | POA: Diagnosis not present

## 2020-08-27 DIAGNOSIS — B0089 Other herpesviral infection: Secondary | ICD-10-CM | POA: Diagnosis not present

## 2020-08-27 DIAGNOSIS — L720 Epidermal cyst: Secondary | ICD-10-CM | POA: Diagnosis not present

## 2020-08-27 DIAGNOSIS — L820 Inflamed seborrheic keratosis: Secondary | ICD-10-CM | POA: Diagnosis not present

## 2020-08-31 ENCOUNTER — Other Ambulatory Visit: Payer: Self-pay | Admitting: Internal Medicine

## 2020-10-15 ENCOUNTER — Other Ambulatory Visit: Payer: Self-pay

## 2020-10-15 ENCOUNTER — Encounter: Payer: Self-pay | Admitting: Internal Medicine

## 2020-10-15 ENCOUNTER — Ambulatory Visit (INDEPENDENT_AMBULATORY_CARE_PROVIDER_SITE_OTHER): Payer: Medicare HMO | Admitting: Internal Medicine

## 2020-10-15 VITALS — BP 120/78 | HR 79 | Temp 98.8°F | Resp 18 | Ht 64.0 in | Wt 170.2 lb

## 2020-10-15 DIAGNOSIS — Z0000 Encounter for general adult medical examination without abnormal findings: Secondary | ICD-10-CM

## 2020-10-15 DIAGNOSIS — I10 Essential (primary) hypertension: Secondary | ICD-10-CM | POA: Diagnosis not present

## 2020-10-15 LAB — COMPREHENSIVE METABOLIC PANEL
ALT: 14 U/L (ref 0–35)
AST: 14 U/L (ref 0–37)
Albumin: 4 g/dL (ref 3.5–5.2)
Alkaline Phosphatase: 89 U/L (ref 39–117)
BUN: 22 mg/dL (ref 6–23)
CO2: 31 mEq/L (ref 19–32)
Calcium: 10.1 mg/dL (ref 8.4–10.5)
Chloride: 102 mEq/L (ref 96–112)
Creatinine, Ser: 0.88 mg/dL (ref 0.40–1.20)
GFR: 62.99 mL/min (ref >60.00)
Glucose, Bld: 96 mg/dL (ref 70–99)
Potassium: 4.6 mEq/L (ref 3.5–5.1)
Sodium: 138 mEq/L (ref 135–145)
Total Bilirubin: 1 mg/dL (ref 0.2–1.2)
Total Protein: 6.9 g/dL (ref 6.0–8.3)

## 2020-10-15 LAB — CBC
HCT: 43.6 % (ref 36.0–46.0)
Hemoglobin: 14.9 g/dL (ref 12.0–15.0)
MCHC: 34.1 g/dL (ref 30.0–36.0)
MCV: 93.5 fl (ref 78.0–100.0)
Platelets: 312 10*3/uL (ref 150.0–400.0)
RBC: 4.67 Mil/uL (ref 3.87–5.11)
RDW: 13.4 % (ref 11.5–15.5)
WBC: 5.9 10*3/uL (ref 4.0–10.5)

## 2020-10-15 LAB — LIPID PANEL
Cholesterol: 212 mg/dL — ABNORMAL HIGH (ref 0–200)
HDL: 63.8 mg/dL (ref >39.00)
LDL Cholesterol: 132 mg/dL — ABNORMAL HIGH (ref 0–99)
NonHDL: 148.31
Total CHOL/HDL Ratio: 3
Triglycerides: 84 mg/dL (ref 0.0–149.0)
VLDL: 16.8 mg/dL (ref 0.0–40.0)

## 2020-10-15 LAB — HEMOGLOBIN A1C: Hgb A1c MFr Bld: 5.5 % (ref 4.6–6.5)

## 2020-10-15 NOTE — Patient Instructions (Addendum)
Turmeric and glucosamine can help with the joint.    Health Maintenance, Female Adopting a healthy lifestyle and getting preventive care are important in promoting health and wellness. Ask your health care provider about:  The right schedule for you to have regular tests and exams.  Things you can do on your own to prevent diseases and keep yourself healthy. What should I know about diet, weight, and exercise? Eat a healthy diet  Eat a diet that includes plenty of vegetables, fruits, low-fat dairy products, and lean protein.  Do not eat a lot of foods that are high in solid fats, added sugars, or sodium.   Maintain a healthy weight Body mass index (BMI) is used to identify weight problems. It estimates body fat based on height and weight. Your health care provider can help determine your BMI and help you achieve or maintain a healthy weight. Get regular exercise Get regular exercise. This is one of the most important things you can do for your health. Most adults should:  Exercise for at least 150 minutes each week. The exercise should increase your heart rate and make you sweat (moderate-intensity exercise).  Do strengthening exercises at least twice a week. This is in addition to the moderate-intensity exercise.  Spend less time sitting. Even light physical activity can be beneficial. Watch cholesterol and blood lipids Have your blood tested for lipids and cholesterol at 79 years of age, then have this test every 5 years. Have your cholesterol levels checked more often if:  Your lipid or cholesterol levels are high.  You are older than 79 years of age.  You are at high risk for heart disease. What should I know about cancer screening? Depending on your health history and family history, you may need to have cancer screening at various ages. This may include screening for:  Breast cancer.  Cervical cancer.  Colorectal cancer.  Skin cancer.  Lung cancer. What should I  know about heart disease, diabetes, and high blood pressure? Blood pressure and heart disease  High blood pressure causes heart disease and increases the risk of stroke. This is more likely to develop in people who have high blood pressure readings, are of African descent, or are overweight.  Have your blood pressure checked: ? Every 3-5 years if you are 37-4 years of age. ? Every year if you are 24 years old or older. Diabetes Have regular diabetes screenings. This checks your fasting blood sugar level. Have the screening done:  Once every three years after age 52 if you are at a normal weight and have a low risk for diabetes.  More often and at a younger age if you are overweight or have a high risk for diabetes. What should I know about preventing infection? Hepatitis B If you have a higher risk for hepatitis B, you should be screened for this virus. Talk with your health care provider to find out if you are at risk for hepatitis B infection. Hepatitis C Testing is recommended for:  Everyone born from [birth year] through 1965.  Anyone with known risk factors for hepatitis C. Sexually transmitted infections (STIs)  Get screened for STIs, including gonorrhea and chlamydia, if: ? You are sexually active and are younger than 79 years of age. ? You are older than 79 years of age and your health care provider tells you that you are at risk for this type of infection. ? Your sexual activity has changed since you were last screened, and you are at  increased risk for chlamydia or gonorrhea. Ask your health care provider if you are at risk.  Ask your health care provider about whether you are at high risk for HIV. Your health care provider may recommend a prescription medicine to help prevent HIV infection. If you choose to take medicine to prevent HIV, you should first get tested for HIV. You should then be tested every 3 months for as long as you are taking the medicine. Pregnancy  If you are  about to stop having your period (premenopausal) and you may become pregnant, seek counseling before you get pregnant.  Take 400 to 800 micrograms (mcg) of folic acid every day if you become pregnant.  Ask for birth control (contraception) if you want to prevent pregnancy. Osteoporosis and menopause Osteoporosis is a disease in which the bones lose minerals and strength with aging. This can result in bone fractures. If you are 36 years old or older, or if you are at risk for osteoporosis and fractures, ask your health care provider if you should:  Be screened for bone loss.  Take a calcium or vitamin D supplement to lower your risk of fractures.  Be given hormone replacement therapy (HRT) to treat symptoms of menopause. Follow these instructions at home: Lifestyle  Do not use any products that contain nicotine or tobacco, such as cigarettes, e-cigarettes, and chewing tobacco. If you need help quitting, ask your health care provider.  Do not use street drugs.  Do not share needles.  Ask your health care provider for help if you need support or information about quitting drugs. Alcohol use  Do not drink alcohol if: ? Your health care provider tells you not to drink. ? You are pregnant, may be pregnant, or are planning to become pregnant.  If you drink alcohol: ? Limit how much you use to 0-1 drink a day. ? Limit intake if you are breastfeeding.  Be aware of how much alcohol is in your drink. In the [place] one drink equals one 12 oz bottle of beer (355 mL), one 5 oz glass of wine (148 mL), or one 1 oz glass of hard liquor (44 mL). General instructions  Schedule regular health, dental, and eye exams.  Stay current with your vaccines.  Tell your health care provider if: ? You often feel depressed. ? You have ever been abused or do not feel safe at home. Summary  Adopting a healthy lifestyle and getting preventive care are important in promoting health and wellness.  Follow  your health care provider's instructions about healthy diet, exercising, and getting tested or screened for diseases.  Follow your health care provider's instructions on monitoring your cholesterol and blood pressure. This information is not intended to replace advice given to you by your health care provider. Make sure you discuss any questions you have with your health care provider. Document Revised: 07/12/2018 Document Reviewed: 07/12/2018 Elsevier Patient Education  2021 [employer].

## 2020-10-15 NOTE — Progress Notes (Addendum)
Subjective:   Patient ID: Sharon Zavala, female    DOB: 24-Sep-1940, 80 y.o.   MRN: [id number]  HPI Here for medicare wellness and physical, no new complaints. Please see A/P for status and treatment of chronic medical problems.   Diet: heart healthy  Physical activity: sedentary Depression/mood screen: negative Hearing: intact to whispered voice Visual acuity: grossly normal, performs annual eye exam  ADLs: capable Fall risk: none Home safety: good Cognitive evaluation: intact to orientation, naming, recall and repetition EOL planning: adv directives discussed  [place] Visit from 10/15/2020 in [place] at [facility]  PHQ-2 Total Score 0      [place] from 11/07/2017 in [place]  PHQ-9 Total Score 0      I have personally reviewed and have noted 1. The patient's medical and social history - reviewed today no changes 2. Their use of alcohol, tobacco or illicit drugs 3. Their current medications and supplements 4. The patient's functional ability including ADL's, fall risks, home safety risks and hearing or visual impairment. 5. Diet and physical activities 6. Evidence for depression or mood disorders 7. Care team reviewed and updated 8.  The patient is not on an opioid pain medication.  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   HENT: Negative.   Eyes: Negative.   Respiratory: Negative for cough, chest tightness and shortness of breath.   Cardiovascular: Negative for chest pain, palpitations and leg swelling.  Gastrointestinal: Negative for abdominal distention, abdominal pain, constipation, diarrhea, nausea and vomiting.  Musculoskeletal: Positive for arthralgias.  Skin: Negative.   Neurological: Negative.   Psychiatric/Behavioral: Negative.     Objective:  Physical Exam Constitutional:      Appearance: She is well-developed.  HENT:     Head: Normocephalic and atraumatic.  Cardiovascular:     Rate and Rhythm: Normal rate and regular rhythm.  Pulmonary:     Effort: Pulmonary effort is normal. No respiratory distress.     Breath sounds: Normal breath sounds. No wheezing or rales.  Abdominal:     General: Bowel sounds are normal. There is no distension.     Palpations: Abdomen is soft.     Tenderness: There is no abdominal tenderness. There is no rebound.  Musculoskeletal:        General: Tenderness present.     Cervical back: Normal range of motion.  Skin:    General: Skin is warm and dry.  Neurological:     Mental Status: She is alert and  oriented to person, place, and time.     Coordination: Coordination normal.     Vitals:   10/15/20 1113  BP: 120/78  Pulse: 79  Resp: 18  Temp: 98.8 F (37.1 C)  TempSrc: Oral  SpO2: 97%  Weight: 170 lb 3.2 oz (77.2 kg)  Height: 5\' 4"  (1.626 m)   This visit occurred during the SARS-CoV-2 public health emergency.  Safety protocols were in place, including screening questions prior to the visit, additional usage of staff PPE, and extensive cleaning of exam room  while observing appropriate contact time as indicated for disinfecting solutions.   Assessment & Plan:

## 2020-10-17 ENCOUNTER — Encounter: Payer: Self-pay | Admitting: Internal Medicine

## 2020-10-17 NOTE — Assessment & Plan Note (Signed)
Flu shot declines. Covid-19 2 shots counseled about booster. Pneumonia complete. Shingrix counseled. Tetanus due declines. Colonoscopy aged out. Mammogram yearly up to date, pap smear aged out and dexa declines further. Counseled about sun safety and mole surveillance. Counseled about the dangers of distracted driving. Given 10 year screening recommendations.

## 2020-10-17 NOTE — Assessment & Plan Note (Signed)
Taking lisinopril/hctz and BP at goal. Checking labs and adjust as needed.

## 2021-05-21 ENCOUNTER — Other Ambulatory Visit: Payer: Self-pay | Admitting: Internal Medicine

## 2021-09-30 ENCOUNTER — Telehealth: Payer: Self-pay | Admitting: Internal Medicine

## 2021-09-30 NOTE — Telephone Encounter (Signed)
LVM for pt to rtn my call to schedule awv with nha. Please schedule this appt if pt calls the office.  ?

## 2021-10-20 ENCOUNTER — Ambulatory Visit (INDEPENDENT_AMBULATORY_CARE_PROVIDER_SITE_OTHER): Payer: Medicare HMO

## 2021-10-20 DIAGNOSIS — L814 Other melanin hyperpigmentation: Secondary | ICD-10-CM | POA: Diagnosis not present

## 2021-10-20 DIAGNOSIS — D2362 Other benign neoplasm of skin of left upper limb, including shoulder: Secondary | ICD-10-CM | POA: Diagnosis not present

## 2021-10-20 DIAGNOSIS — Z0000 Encounter for general adult medical examination without abnormal findings: Secondary | ICD-10-CM | POA: Diagnosis not present

## 2021-10-20 DIAGNOSIS — Z85828 Personal history of other malignant neoplasm of skin: Secondary | ICD-10-CM | POA: Diagnosis not present

## 2021-10-20 DIAGNOSIS — L821 Other seborrheic keratosis: Secondary | ICD-10-CM | POA: Diagnosis not present

## 2021-10-20 DIAGNOSIS — Z1211 Encounter for screening for malignant neoplasm of colon: Secondary | ICD-10-CM

## 2021-10-20 DIAGNOSIS — L570 Actinic keratosis: Secondary | ICD-10-CM | POA: Diagnosis not present

## 2021-10-20 DIAGNOSIS — L720 Epidermal cyst: Secondary | ICD-10-CM | POA: Diagnosis not present

## 2021-10-20 NOTE — Progress Notes (Signed)
?I connected with Sharon Zavala today by telephone and verified that I am speaking with the correct person using two identifiers. ?Location patient: home ?Location provider: work ?Persons participating in the virtual visit: patient, provider. ?  ?I discussed the limitations, risks, security and privacy concerns of performing an evaluation and management service by telephone and the availability of in person appointments. I also discussed with the patient that there may be a patient responsible charge related to this service. The patient expressed understanding and verbally consented to this telephonic visit.  ?  ?Interactive audio and video telecommunications were attempted between this provider and patient, however failed, due to patient having technical difficulties OR patient did not have access to video capability.  We continued and completed visit with audio only. ? ?Some vital signs may be absent or patient reported.  ? ?Time Spent with patient on telephone encounter: 40 minutes ? ?Subjective:  ? Sharon Zavala is a 81 y.o. female who presents for Medicare Annual (Subsequent) preventive examination. ? ?Review of Systems    ? ?Cardiac Risk Factors include: advanced age (>67mn, >>60women);hypertension ? ?   ?Objective:  ?  ?Today's Vitals  ? 10/20/21 1425  ?PainSc: 0-No pain  ? ?There is no height or weight on file to calculate BMI. ? ?Advanced Directives 10/20/2021 11/07/2017 10/07/2017  ?Does Patient Have a Medical Advance Directive? No No No  ?Would patient like information on creating a medical advance directive? No - Patient declined No - Patient declined No - Patient declined  ? ? ?Current Medications (verified) ?Outpatient Encounter Medications as of 10/20/2021  ?Medication Sig  ? lisinopril-hydrochlorothiazide (ZESTORETIC) 10-12.5 MG tablet TAKE 1 TABLET BY MOUTH EVERY DAY  ? Multiple Vitamins-Minerals (MULTIVITAMIN ADULT PO) Take by mouth daily.  ? ?No facility-administered encounter medications on file  as of 10/20/2021.  ? ? ?Allergies (verified) ?Patient has no known allergies.  ? ?History: ?Past Medical History:  ?Diagnosis Date  ? 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 Routine general medical examination at a health care facility   ? Unspecified essential hypertension   ? ?Past Surgical History:  ?Procedure Laterality Date  ? MASTECTOMY MODIFIED RADICAL  '89 & '97  ? Bilateral  ? TONSILLECTOMY    ? ?Family History  ?Problem Relation Age of Onset  ? Prostate cancer Father 856 ?     deceased  ? Other Mother 928 ?     natural causes  ? Breast cancer Sister   ? Colon cancer Neg Hx   ? Diabetes Neg Hx   ? Coronary artery disease Neg Hx   ? ?Social History  ? ?Socioeconomic History  ? Marital status: Married  ?  Spouse name: Not on file  ? Number of children: 1  ? Years of education: Not on file  ? Highest education level: Not on file  ?Occupational History  ? Not on file  ?Tobacco Use  ? Smoking status: Never  ? Smokeless tobacco: Never  ?Vaping Use  ? Vaping Use: Never used  ?Substance and Sexual Activity  ? Alcohol use: Yes  ?  Comment: occ  ? Drug use: No  ? Sexual activity: Not Currently  ?Other Topics Concern  ? Not on file  ?Social History Narrative  ? [employer]-BA English  ? Married '67  ? 1 daughter - '68; 1 grandchild  ? Lives with husband - independently ; he has made a good recovery from prostate surgery-'09  ? ?Social Determinants of  Health  ? ?Financial Resource Strain: Low Risk   ? Difficulty of Paying Living Expenses: Not hard at all  ?Food Insecurity: No Food Insecurity  ? Worried About Charity fundraiser in the Last Year: Never true  ? Ran Out of Food in the Last Year: Never true  ?Transportation Needs: No Transportation Needs  ? Lack of Transportation (Medical): No  ? Lack of Transportation (Non-Medical): No  ?Physical Activity: Sufficiently Active  ? Days of Exercise per Week: 5 days  ? Minutes of Exercise per Session: 30 min  ?Stress: No Stress Concern Present  ?  Feeling of Stress : Not at all  ?Social Connections: Moderately Isolated  ? Frequency of Communication with Friends and Family: More than three times a week  ? Frequency of Social Gatherings with Friends and Family: More than three times a week  ? Attends Religious Services: Never  ? Active Member of Clubs or Organizations: No  ? Attends Archivist Meetings: Never  ? Marital Status: Married  ? ? ?Tobacco Counseling ?Counseling given: Not Answered ? ? ?Clinical Intake: ? ?Pre-visit preparation completed: Yes ? ?Pain : 0-10 ?Pain Score: 0-No pain ?Pain Type: Chronic pain ?Pain Location: Knee ?Pain Orientation: Right ?Pain Radiating Towards: none ?Pain Descriptors / Indicators: Other (Comment) (Stiffness) ?Pain Onset: More than a month ago ?Pain Frequency: Intermittent ?Pain Relieving Factors: none ? ?Pain Relieving Factors: none ? ?Nutritional Risks: None ?Diabetes: No ? ?How often do you need to have someone help you when you read instructions, pamphlets, or other written materials from your doctor or pharmacy?: 1 - Never ?What is the last grade level you completed in school?: Bachelors of Art Degree ? ?Diabetic? no ? ?Interpreter Needed?: No ? ?Information entered by :: Lisette Abu, LPN ? ? ?Activities of Daily Living ?In your present state of health, do you have any difficulty performing the following activities: 10/20/2021  ?Hearing? N  ?Vision? N  ?Difficulty concentrating or making decisions? N  ?Walking or climbing stairs? N  ?Dressing or bathing? N  ?Doing errands, shopping? N  ?Preparing Food and eating ? N  ?Using the Toilet? N  ?In the past six months, have you accidently leaked urine? N  ?Do you have problems with loss of bowel control? N  ?Managing your Medications? N  ?Managing your Finances? N  ?Housekeeping or managing your Housekeeping? N  ?Some recent data might be hidden  ? ? ?Patient Care Team: ?Hoyt Koch, MD as PCP - General (Internal Medicine) ?Marylynn Pearson, MD  (Obstetrics and Gynecology) ?Griselda Miner, MD as Consulting Physician (Dermatology) ?Keene Breath., MD (Ophthalmology) ?Eustace Moore, MD as Consulting Physician (Neurosurgery) ? ?Indicate any recent Medical Services you may have received from other than [facility] providers in the past year (date may be approximate). ? ?   ?Assessment:  ? This is a routine wellness examination for Sharon Zavala. ? ?Hearing/Vision screen ?Hearing Screening - Comments:: Patient denied any hearing difficulty.   ?No hearing aids. ? ?Vision Screening - Comments:: Patient does wear corrective lenses/contacts.   ?Eye exam done by: Dr. Barbie Haggis ? ?Dietary issues and exercise activities discussed: ?Current Exercise Habits: Home exercise routine, Type of exercise: walking, Time (Minutes): 30, Frequency (Times/Week): 5, Weekly Exercise (Minutes/Week): 150, Intensity: Moderate, Exercise limited by: None identified ? ? Goals Addressed   ? ?  ?  ?  ?  ?  ? This Visit's Progress  ?   Client understands the importance of follow-up with providers by attending  scheduled visits (pt-stated)     ?   I would like to increase my energy level. ?  ? ?  ?Depression Screen ?PHQ 2/9 Scores 10/20/2021 10/15/2020 10/12/2019 10/10/2018 11/07/2017 10/02/2015  ?PHQ - 2 Score 0 0 0 0 0 0  ?PHQ- 9 Score - - - - 0 -  ?  ?Fall Risk ?Fall Risk  10/20/2021 10/15/2020 10/12/2019 10/10/2018 11/07/2017  ?Falls in the past year? 1 1 0 0 Yes  ?Number falls in past yr: 0 0 0 - 1  ?Injury with Fall? 1 0 0 - Yes  ?Follow up Falls evaluation completed - - - Falls prevention discussed  ? ? ?FALL RISK PREVENTION PERTAINING TO THE HOME: ? ?Any stairs in or around the home? Yes  ?If so, are there any without handrails? No  ?Home free of loose throw rugs in walkways, pet beds, electrical cords, etc? Yes  ?Adequate lighting in your home to reduce risk of falls? Yes  ? ?ASSISTIVE DEVICES UTILIZED TO PREVENT FALLS: ? ?Life alert? No  ?Use of a cane, walker or w/c? No  ?Grab bars in the bathroom? No   ?Shower chair or bench in shower? Yes  ?Elevated toilet seat or a handicapped toilet? Yes  ? ?TIMED UP AND GO: ? ?Was the test performed? No .  ?Length of time to ambulate 10 feet: n/a sec.  ? ?Gait steady and fas

## 2021-10-20 NOTE — Patient Instructions (Signed)
Ms. Sharon Zavala , ?Thank you for taking time to come for your Medicare Wellness Visit. I appreciate your ongoing commitment to your health goals. Please review the following plan we discussed and let me know if I can assist you in the future.  ? ?Screening recommendations/referrals: ?Colonoscopy: Ordered Cologuard per patient request ?Mammogram: No longer recommended ?Bone Density: No longer recommended ?Recommended yearly ophthalmology/optometry visit for glaucoma screening and checkup ?Recommended yearly dental visit for hygiene and checkup ? ?Vaccinations: ?Influenza vaccine: 04/11/2021 ?Pneumococcal vaccine: 01/27/2009, 09/24/2013 ?Tdap vaccine: 09/18/2010; due every 10 years (overdue) ?Shingles vaccine: 11/08/2019, 06/25/2019   ?Covid-19: 08/23/2019, 09/13/2019, 05/31/2020 ? ?Advanced directives: Advance directive discussed with you today. Even though you declined this today please call our office should you change your mind and we can give you the proper paperwork for you to fill out. ? ?Conditions/risks identified: Yes; Client understands the importance of follow-up appointments with providers by attending scheduled visits and discussed goals to eat healthier, increase physical activity 5 times a week for 30 minutes each, exercise the brain by doing stimulating brain exercises (reading, adult coloring, crafting, listening to music, puzzles, etc.), socialize and enjoy life more, get enough sleep at least 8-9 hours average per night and make time for laughter. ? ?Next appointment: Please schedule your next Medicare Wellness Visit with your Nurse Health Advisor in 1 year or 366 days by calling [phone]. ? ? ?Preventive Care 102 Years and Older, Female ?Preventive care refers to lifestyle choices and visits with your health care provider that can promote health and wellness. ?What does preventive care include? ?A yearly physical exam. This is also called an annual well check. ?Dental exams once or twice a year. ?Routine  eye exams. Ask your health care provider how often you should have your eyes checked. ?Personal lifestyle choices, including: ?Daily care of your teeth and gums. ?Regular physical activity. ?Eating a healthy diet. ?Avoiding tobacco and drug use. ?Limiting alcohol use. ?Practicing safe sex. ?Taking low-dose aspirin every day. ?Taking vitamin and mineral supplements as recommended by your health care provider. ?What happens during an annual well check? ?The services and screenings done by your health care provider during your annual well check will depend on your age, overall health, lifestyle risk factors, and family history of disease. ?Counseling  ?Your health care provider may ask you questions about your: ?Alcohol use. ?Tobacco use. ?Drug use. ?Emotional well-being. ?Home and relationship well-being. ?Sexual activity. ?Eating habits. ?History of falls. ?Memory and ability to understand (cognition). ?Work and work Statistician. ?Reproductive health. ?Screening  ?You may have the following tests or measurements: ?Height, weight, and BMI. ?Blood pressure. ?Lipid and cholesterol levels. These may be checked every 5 years, or more frequently if you are over 17 years old. ?Skin check. ?Lung cancer screening. You may have this screening every year starting at age 79 if you have a 30-pack-year history of smoking and currently smoke or have quit within the past 15 years. ?Fecal occult blood test (FOBT) of the stool. You may have this test every year starting at age 107. ?Flexible sigmoidoscopy or colonoscopy. You may have a sigmoidoscopy every 5 years or a colonoscopy every 10 years starting at age 76. ?Hepatitis C blood test. ?Hepatitis B blood test. ?Sexually transmitted disease (STD) testing. ?Diabetes screening. This is done by checking your blood sugar (glucose) after you have not eaten for a while (fasting). You may have this done every 1-3 years. ?Bone density scan. This is done to screen for osteoporosis. You may  have this done starting at age 50. ?Mammogram. This may be done every 1-2 years. Talk to your health care provider about how often you should have regular mammograms. ?Talk with your health care provider about your test results, treatment options, and if necessary, the need for more tests. ?Vaccines  ?Your health care provider may recommend certain vaccines, such as: ?Influenza vaccine. This is recommended every year. ?Tetanus, diphtheria, and acellular pertussis (Tdap, Td) vaccine. You may need a Td booster every 10 years. ?Zoster vaccine. You may need this after age 36. ?Pneumococcal 13-valent conjugate (PCV13) vaccine. One dose is recommended after age 62. ?Pneumococcal polysaccharide (PPSV23) vaccine. One dose is recommended after age 41. ?Talk to your health care provider about which screenings and vaccines you need and how often you need them. ?This information is not intended to replace advice given to you by your health care provider. Make sure you discuss any questions you have with your health care provider. ?Document Released: 08/15/2015 Document Revised: 04/07/2016 Document Reviewed: 05/20/2015 ?Elsevier Interactive Patient Education ? 2017 [place]. ? ?Fall Prevention in the Home ?Falls can cause injuries. They can happen to people of all ages. There are many things you can do to make your home safe and to help prevent falls. ?What can I do on the outside of my home? ?Regularly fix the edges of walkways and driveways and fix any cracks. ?Remove anything that might make you trip as you walk through a door, such as a raised step or threshold. ?Trim any bushes or trees on the path to your home. ?Use bright outdoor lighting. ?Clear any walking paths of anything that might make someone trip, such as rocks or tools. ?Regularly check to see if handrails are loose or broken. Make sure that both sides of any steps have handrails. ?Any raised decks and porches should have guardrails on the edges. ?Have any  leaves, snow, or ice cleared regularly. ?Use sand or salt on walking paths during winter. ?Clean up any spills in your garage right away. This includes oil or grease spills. ?What can I do in the bathroom? ?Use night lights. ?Install grab bars by the toilet and in the tub and shower. Do not use towel bars as grab bars. ?Use non-skid mats or decals in the tub or shower. ?If you need to sit down in the shower, use a plastic, non-slip stool. ?Keep the floor dry. Clean up any water that spills on the floor as soon as it happens. ?Remove soap buildup in the tub or shower regularly. ?Attach bath mats securely with double-sided non-slip rug tape. ?Do not have throw rugs and other things on the floor that can make you trip. ?What can I do in the bedroom? ?Use night lights. ?Make sure that you have a light by your bed that is easy to reach. ?Do not use any sheets or blankets that are too big for your bed. They should not hang down onto the floor. ?Have a firm chair that has side arms. You can use this for support while you get dressed. ?Do not have throw rugs and other things on the floor that can make you trip. ?What can I do in the kitchen? ?Clean up any spills right away. ?Avoid walking on wet floors. ?Keep items that you use a lot in easy-to-reach places. ?If you need to reach something above you, use a strong step stool that has a grab bar. ?Keep electrical cords out of the way. ?Do not use floor polish or  wax that makes floors slippery. If you must use wax, use non-skid floor wax. ?Do not have throw rugs and other things on the floor that can make you trip. ?What can I do with my stairs? ?Do not leave any items on the stairs. ?Make sure that there are handrails on both sides of the stairs and use them. Fix handrails that are broken or loose. Make sure that handrails are as long as the stairways. ?Check any carpeting to make sure that it is firmly attached to the stairs. Fix any carpet that is loose or worn. ?Avoid  having throw rugs at the top or bottom of the stairs. If you do have throw rugs, attach them to the floor with carpet tape. ?Make sure that you have a light switch at the top of the stairs and the bottom of t

## 2021-10-21 ENCOUNTER — Encounter: Payer: Self-pay | Admitting: Internal Medicine

## 2021-10-21 ENCOUNTER — Other Ambulatory Visit: Payer: Self-pay

## 2021-10-21 ENCOUNTER — Ambulatory Visit (INDEPENDENT_AMBULATORY_CARE_PROVIDER_SITE_OTHER): Payer: Medicare HMO | Admitting: Internal Medicine

## 2021-10-21 VITALS — BP 122/80 | HR 85 | Resp 18 | Ht 64.0 in | Wt 168.6 lb

## 2021-10-21 DIAGNOSIS — I10 Essential (primary) hypertension: Secondary | ICD-10-CM

## 2021-10-21 DIAGNOSIS — Z0000 Encounter for general adult medical examination without abnormal findings: Secondary | ICD-10-CM

## 2021-10-21 LAB — LIPID PANEL
Cholesterol: 185 mg/dL (ref 0–200)
HDL: 68.6 mg/dL (ref >39.00)
LDL Cholesterol: 100 mg/dL — ABNORMAL HIGH (ref 0–99)
NonHDL: 116.74
Total CHOL/HDL Ratio: 3
Triglycerides: 82 mg/dL (ref 0.0–149.0)
VLDL: 16.4 mg/dL (ref 0.0–40.0)

## 2021-10-21 LAB — COMPREHENSIVE METABOLIC PANEL
ALT: 14 U/L (ref 0–35)
AST: 16 U/L (ref 0–37)
Albumin: 4.2 g/dL (ref 3.5–5.2)
Alkaline Phosphatase: 84 U/L (ref 39–117)
BUN: 19 mg/dL (ref 6–23)
CO2: 30 mEq/L (ref 19–32)
Calcium: 9.5 mg/dL (ref 8.4–10.5)
Chloride: 103 mEq/L (ref 96–112)
Creatinine, Ser: 0.87 mg/dL (ref 0.40–1.20)
GFR: 63.4 mL/min (ref >60.00)
Glucose, Bld: 100 mg/dL — ABNORMAL HIGH (ref 70–99)
Potassium: 4.5 mEq/L (ref 3.5–5.1)
Sodium: 140 mEq/L (ref 135–145)
Total Bilirubin: 0.8 mg/dL (ref 0.2–1.2)
Total Protein: 6.7 g/dL (ref 6.0–8.3)

## 2021-10-21 LAB — CBC
HCT: 42.5 % (ref 36.0–46.0)
Hemoglobin: 14.4 g/dL (ref 12.0–15.0)
MCHC: 33.9 g/dL (ref 30.0–36.0)
MCV: 93.9 fl (ref 78.0–100.0)
Platelets: 317 10*3/uL (ref 150.0–400.0)
RBC: 4.53 Mil/uL (ref 3.87–5.11)
RDW: 13.6 % (ref 11.5–15.5)
WBC: 7.2 10*3/uL (ref 4.0–10.5)

## 2021-10-21 LAB — HEMOGLOBIN A1C: Hgb A1c MFr Bld: 5.6 % (ref 4.6–6.5)

## 2021-10-21 NOTE — Patient Instructions (Signed)
Glucosamine and turmeric can help with arthritis.  ?

## 2021-10-21 NOTE — Progress Notes (Signed)
? ?  Subjective:  ? ?Patient ID: Sharon Zavala, female    DOB: 07-15-1941, 81 y.o.   MRN: [id number] ? ?HPI ?The patient is here for physical. ? ?PMH, [facility], social history reviewed and updated ? ?Review of Systems  ?Constitutional: Negative.   ?HENT: Negative.    ?Eyes: Negative.   ?Respiratory:  Negative for cough, chest tightness and shortness of breath.   ?Cardiovascular:  Negative for chest pain, palpitations and leg swelling.  ?Gastrointestinal:  Negative for abdominal distention, abdominal pain, constipation, diarrhea, nausea and vomiting.  ?Musculoskeletal: Negative.   ?Skin: Negative.   ?Neurological: Negative.   ?Psychiatric/Behavioral: Negative.    ? ?Objective:  ?Physical Exam ?Constitutional:   ?   Appearance: She is well-developed.  ?HENT:  ?   Head: Normocephalic and atraumatic.  ?Cardiovascular:  ?   Rate and Rhythm: Normal rate and regular rhythm.  ?Pulmonary:  ?   Effort: Pulmonary effort is normal. No respiratory distress.  ?   Breath sounds: Normal breath sounds. No wheezing or rales.  ?Abdominal:  ?   General: Bowel sounds are normal. There is no distension.  ?   Palpations: Abdomen is soft.  ?   Tenderness: There is no abdominal tenderness. There is no rebound.  ?Musculoskeletal:  ?   Cervical back: Normal range of motion.  ?Skin: ?   General: Skin is warm and dry.  ?Neurological:  ?   Mental Status: She is alert and oriented to person, place, and time.  ?   Coordination: Coordination normal.  ? ? ?Vitals:  ? 10/21/21 1033  ?BP: 122/80  ?Pulse: 85  ?Resp: 18  ?SpO2: 96%  ?Weight: 168 lb 9.6 oz (76.5 kg)  ?Height: '5\' 4"'$  (1.626 m)  ? ? ?This visit occurred during the SARS-CoV-2 public health emergency.  Safety protocols were in place, including screening questions prior to the visit, additional usage of staff PPE, and extensive cleaning of exam room while observing appropriate contact time as indicated for disinfecting solutions.  ? ?Assessment & Plan:  ? ?

## 2021-10-23 NOTE — Assessment & Plan Note (Signed)
BP at goal on lisinopril/hctz 10/12.5 mg daily. Checking CBC CMP and lipid panel and adjust dosing as needed.  ?

## 2021-10-23 NOTE — Assessment & Plan Note (Signed)
Flu shot up to date. Covid-19 counseled. Pneumonia complete. Shingrix complete. Tetanus up to date. Cologuard ordered. Mammogram not indicated, pap smear aged out and dexa complete. Counseled about sun safety and mole surveillance. Counseled about the dangers of distracted driving. Given 10 year screening recommendations.  ? ?

## 2021-11-21 DIAGNOSIS — Z1211 Encounter for screening for malignant neoplasm of colon: Secondary | ICD-10-CM | POA: Diagnosis not present

## 2021-12-01 LAB — COLOGUARD: COLOGUARD: NEGATIVE

## 2022-02-19 ENCOUNTER — Other Ambulatory Visit: Payer: Self-pay | Admitting: Internal Medicine

## 2022-08-15 ENCOUNTER — Other Ambulatory Visit: Payer: Self-pay | Admitting: Internal Medicine

## 2022-08-30 DIAGNOSIS — Z0184 Encounter for antibody response examination: Secondary | ICD-10-CM | POA: Diagnosis not present

## 2022-10-19 ENCOUNTER — Telehealth: Payer: Self-pay

## 2022-10-19 NOTE — Telephone Encounter (Signed)
Called patient to schedule Medicare Annual Wellness Visit (AWV). Left message for patient to call back and schedule Medicare Annual Wellness Visit (AWV).  Last date of AWV: 10/20/21  Please schedule an appointment at any time.    Norton Blizzard, [place] ([facility])  [place] [facility] [phone]

## 2022-10-21 DIAGNOSIS — H524 Presbyopia: Secondary | ICD-10-CM | POA: Diagnosis not present

## 2022-10-21 DIAGNOSIS — Z85828 Personal history of other malignant neoplasm of skin: Secondary | ICD-10-CM | POA: Diagnosis not present

## 2022-10-21 DIAGNOSIS — L720 Epidermal cyst: Secondary | ICD-10-CM | POA: Diagnosis not present

## 2022-10-21 DIAGNOSIS — L304 Erythema intertrigo: Secondary | ICD-10-CM | POA: Diagnosis not present

## 2022-10-21 DIAGNOSIS — L570 Actinic keratosis: Secondary | ICD-10-CM | POA: Diagnosis not present

## 2022-10-21 DIAGNOSIS — D235 Other benign neoplasm of skin of trunk: Secondary | ICD-10-CM | POA: Diagnosis not present

## 2022-10-21 DIAGNOSIS — H35033 Hypertensive retinopathy, bilateral: Secondary | ICD-10-CM | POA: Diagnosis not present

## 2022-10-21 DIAGNOSIS — L814 Other melanin hyperpigmentation: Secondary | ICD-10-CM | POA: Diagnosis not present

## 2022-10-21 DIAGNOSIS — D2361 Other benign neoplasm of skin of right upper limb, including shoulder: Secondary | ICD-10-CM | POA: Diagnosis not present

## 2022-10-21 DIAGNOSIS — L821 Other seborrheic keratosis: Secondary | ICD-10-CM | POA: Diagnosis not present

## 2022-10-21 DIAGNOSIS — D692 Other nonthrombocytopenic purpura: Secondary | ICD-10-CM | POA: Diagnosis not present

## 2022-10-21 DIAGNOSIS — D485 Neoplasm of uncertain behavior of skin: Secondary | ICD-10-CM | POA: Diagnosis not present

## 2022-10-25 ENCOUNTER — Ambulatory Visit (INDEPENDENT_AMBULATORY_CARE_PROVIDER_SITE_OTHER): Payer: Medicare HMO | Admitting: Internal Medicine

## 2022-10-25 ENCOUNTER — Encounter: Payer: Self-pay | Admitting: Internal Medicine

## 2022-10-25 ENCOUNTER — Encounter: Payer: Medicare HMO | Admitting: Internal Medicine

## 2022-10-25 VITALS — BP 140/68 | HR 89 | Temp 97.7°F | Ht 64.0 in | Wt 165.0 lb

## 2022-10-25 DIAGNOSIS — Z853 Personal history of malignant neoplasm of breast: Secondary | ICD-10-CM | POA: Diagnosis not present

## 2022-10-25 DIAGNOSIS — Z0000 Encounter for general adult medical examination without abnormal findings: Secondary | ICD-10-CM

## 2022-10-25 DIAGNOSIS — I10 Essential (primary) hypertension: Secondary | ICD-10-CM

## 2022-10-25 LAB — LIPID PANEL
Cholesterol: 206 mg/dL — ABNORMAL HIGH (ref 0–200)
HDL: 71.9 mg/dL (ref >39.00)
LDL Cholesterol: 114 mg/dL — ABNORMAL HIGH (ref 0–99)
NonHDL: 133.62
Total CHOL/HDL Ratio: 3
Triglycerides: 96 mg/dL (ref 0.0–149.0)
VLDL: 19.2 mg/dL (ref 0.0–40.0)

## 2022-10-25 LAB — CBC
HCT: 44.9 % (ref 36.0–46.0)
Hemoglobin: 15.2 g/dL — ABNORMAL HIGH (ref 12.0–15.0)
MCHC: 34 g/dL (ref 30.0–36.0)
MCV: 94 fl (ref 78.0–100.0)
Platelets: 365 10*3/uL (ref 150.0–400.0)
RBC: 4.77 Mil/uL (ref 3.87–5.11)
RDW: 13.2 % (ref 11.5–15.5)
WBC: 7 10*3/uL (ref 4.0–10.5)

## 2022-10-25 LAB — COMPREHENSIVE METABOLIC PANEL
ALT: 13 U/L (ref 0–35)
AST: 19 U/L (ref 0–37)
Albumin: 4.1 g/dL (ref 3.5–5.2)
Alkaline Phosphatase: 83 U/L (ref 39–117)
BUN: 15 mg/dL (ref 6–23)
CO2: 30 mEq/L (ref 19–32)
Calcium: 10.4 mg/dL (ref 8.4–10.5)
Chloride: 101 mEq/L (ref 96–112)
Creatinine, Ser: 0.95 mg/dL (ref 0.40–1.20)
GFR: 56.65 mL/min — ABNORMAL LOW (ref >60.00)
Glucose, Bld: 104 mg/dL — ABNORMAL HIGH (ref 70–99)
Potassium: 4.6 mEq/L (ref 3.5–5.1)
Sodium: 139 mEq/L (ref 135–145)
Total Bilirubin: 0.6 mg/dL (ref 0.2–1.2)
Total Protein: 6.9 g/dL (ref 6.0–8.3)

## 2022-10-25 MED ORDER — IBUPROFEN 600 MG PO TABS: 600.0000 mg | ORAL_TABLET | Freq: Three times a day (TID) | ORAL | 0 refills | Status: AC | PRN

## 2022-10-25 MED ORDER — LISINOPRIL-HYDROCHLOROTHIAZIDE 10-12.5 MG PO TABS: 1.0000 | ORAL_TABLET | Freq: Every day | ORAL | 3 refills | Status: DC

## 2022-10-25 NOTE — Progress Notes (Unsigned)
Subjective:   Patient ID: Sharon Zavala, female    DOB: 1941/05/06, 82 y.o.   MRN: [id number]  HPI Here for medicare wellness and physical, no new complaints. Please see A/P for status and treatment of chronic medical problems.   Diet: heart healthy Physical activity: sedentary Depression/mood screen: negative Hearing: intact to whispered voice Visual acuity: grossly normal, performs annual eye exam  ADLs: capable Fall risk: none Home safety: good Cognitive evaluation: intact to orientation, naming, recall and repetition EOL planning: adv directives discussed  [place] Visit from 10/25/2022 in [place] at [place] Visit from 10/25/2022 in [place] at [facility]  PHQ-9 Total Score 0         10/10/2018    1:27 PM 10/12/2019    1:17 PM 10/15/2020   11:06 AM 10/20/2021    2:30 PM 10/25/2022    2:03 PM  [place] in the past year? 0 0 1 1 0  Was there an injury with Fall?  0 0 1 0  Fall Risk Category Calculator  0 1 2 0  Fall Risk Category (Retired)  Low Low Moderate   (RETIRED) Patient Fall Risk Level  Low fall risk  Moderate fall risk   Fall risk Follow up    Falls evaluation completed     I have personally reviewed and have noted 1. The patient's medical and social history - reviewed today no changes 2. Their use of alcohol, tobacco or illicit drugs 3. Their current medications and supplements 4. The patient's functional ability including ADL's, fall risks, home safety risks and hearing or visual impairment. 5. Diet and physical activities 6. Evidence for depression or mood disorders 7. Care team reviewed and updated 8.  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   HENT: Negative.    Eyes: Negative.   Respiratory:  Negative for cough, chest tightness and shortness of breath.   Cardiovascular:  Negative for chest pain, palpitations and leg swelling.  Gastrointestinal:  Negative for abdominal distention, abdominal pain, constipation, diarrhea, nausea and vomiting.  Musculoskeletal: Negative.   Skin: Negative.   Neurological: Negative.   Psychiatric/Behavioral: Negative.      Objective:  Physical Exam Constitutional:      Appearance: She is well-developed.  HENT:     Head: Normocephalic and atraumatic.  Cardiovascular:     Rate and  Rhythm: Normal rate and regular rhythm.  Pulmonary:     Effort: Pulmonary effort is normal. No respiratory distress.     Breath sounds: Normal breath sounds. No wheezing or rales.  Abdominal:     General: Bowel sounds are normal. There is no distension.     Palpations: Abdomen is soft.     Tenderness: There is no abdominal tenderness. There is no rebound.  Musculoskeletal:     Cervical back: Normal range of motion.  Skin:    General: Skin is warm and dry.  Neurological:     Mental Status: She is alert and oriented to person, place, and time.     Coordination: Coordination normal.    Vitals:    10/25/22 1400 10/25/22 1403  BP: (!) 140/68 (!) 140/68  Pulse: 89   Temp: 97.7 F (36.5 C)   TempSrc: Oral   SpO2: 95%   Weight: 165 lb (74.8 kg)   Height: 5\' 4"  (1.626 m)     Assessment & Plan:

## 2022-10-26 NOTE — Assessment & Plan Note (Signed)
S/P bilateral mastectomy and not candidate for ongoing screening.

## 2022-10-26 NOTE — Assessment & Plan Note (Signed)
Flu shot up to date. Covid-19 counseled. Pneumonia complete. Shingrix complete. Tetanus due at pharmacy. Colonoscopy aged out. Mammogram aged out, pap smear aged out and dexa complete. Counseled about sun safety and mole surveillance. Counseled about the dangers of distracted driving. Given 10 year screening recommendations.

## 2022-10-26 NOTE — Assessment & Plan Note (Signed)
Checking CMP and CBC and lipid panel. Taking lisinopril/hctz 10/12.5 mg daily and BP at goal. Adjust as needed.

## 2022-11-04 ENCOUNTER — Encounter: Payer: Self-pay | Admitting: Internal Medicine

## 2022-12-08 DIAGNOSIS — M1711 Unilateral primary osteoarthritis, right knee: Secondary | ICD-10-CM | POA: Diagnosis not present

## 2022-12-29 DIAGNOSIS — M1711 Unilateral primary osteoarthritis, right knee: Secondary | ICD-10-CM | POA: Diagnosis not present

## 2023-01-04 DIAGNOSIS — M1711 Unilateral primary osteoarthritis, right knee: Secondary | ICD-10-CM | POA: Diagnosis not present

## 2023-01-07 DIAGNOSIS — M1711 Unilateral primary osteoarthritis, right knee: Secondary | ICD-10-CM | POA: Diagnosis not present

## 2023-01-11 DIAGNOSIS — M1711 Unilateral primary osteoarthritis, right knee: Secondary | ICD-10-CM | POA: Diagnosis not present

## 2023-01-13 DIAGNOSIS — M1711 Unilateral primary osteoarthritis, right knee: Secondary | ICD-10-CM | POA: Diagnosis not present

## 2023-01-17 DIAGNOSIS — M1711 Unilateral primary osteoarthritis, right knee: Secondary | ICD-10-CM | POA: Diagnosis not present

## 2023-01-20 DIAGNOSIS — M1711 Unilateral primary osteoarthritis, right knee: Secondary | ICD-10-CM | POA: Diagnosis not present

## 2023-01-24 DIAGNOSIS — M1711 Unilateral primary osteoarthritis, right knee: Secondary | ICD-10-CM | POA: Diagnosis not present

## 2023-01-26 DIAGNOSIS — M1711 Unilateral primary osteoarthritis, right knee: Secondary | ICD-10-CM | POA: Diagnosis not present

## 2023-02-08 DIAGNOSIS — H40021 Open angle with borderline findings, high risk, right eye: Secondary | ICD-10-CM | POA: Diagnosis not present

## 2023-02-08 DIAGNOSIS — H401121 Primary open-angle glaucoma, left eye, mild stage: Secondary | ICD-10-CM | POA: Diagnosis not present

## 2023-02-08 DIAGNOSIS — H2512 Age-related nuclear cataract, left eye: Secondary | ICD-10-CM | POA: Diagnosis not present

## 2023-02-08 DIAGNOSIS — H2511 Age-related nuclear cataract, right eye: Secondary | ICD-10-CM | POA: Diagnosis not present

## 2023-02-28 ENCOUNTER — Telehealth: Payer: Self-pay | Admitting: Internal Medicine

## 2023-02-28 NOTE — Telephone Encounter (Signed)
Go ahead and Schedule pt appt.Marland KitchenRaechel Chute

## 2023-02-28 NOTE — Telephone Encounter (Signed)
Patient is having eye surgery 03/17/2023 at [facility] and was informed she would need a surgical clearance form filled out along with having an EKG done. She is out of town this week and said she will be out of town again the week of 8/11, which is the soonest opening besides today. Patient said she already has the surgery scheduled and was informed these would need to be completed at least two weeks prior. She would like to know if there is any way she can get scheduled for the week of 8/4. She would like a call back at [phone].

## 2023-03-11 ENCOUNTER — Encounter: Payer: Self-pay | Admitting: Internal Medicine

## 2023-03-11 ENCOUNTER — Ambulatory Visit (INDEPENDENT_AMBULATORY_CARE_PROVIDER_SITE_OTHER): Payer: Medicare HMO | Admitting: Internal Medicine

## 2023-03-11 ENCOUNTER — Telehealth: Payer: Self-pay

## 2023-03-11 VITALS — BP 144/80 | HR 88 | Temp 98.7°F | Ht 64.0 in | Wt 162.0 lb

## 2023-03-11 DIAGNOSIS — I10 Essential (primary) hypertension: Secondary | ICD-10-CM

## 2023-03-11 DIAGNOSIS — Z01810 Encounter for preprocedural cardiovascular examination: Secondary | ICD-10-CM | POA: Diagnosis not present

## 2023-03-11 NOTE — Assessment & Plan Note (Signed)
EKG done without significant change from prior 2019. She is having no active chest pains or SOB. BP at goal. No labs needed as up to date. Low risk for cataract and possible glaucoma stent placement. Cleared to proceed without further evaluation.

## 2023-03-11 NOTE — Assessment & Plan Note (Signed)
BP at goal on lisinopril/hydrochlorothiazide 10/12.5 mg daily. EKG done for pre-op clearance unchanged from 2019.

## 2023-03-11 NOTE — Telephone Encounter (Signed)
Called patient to confirm about surgical clarence and that we have no record of forms for the provider to fill out

## 2023-03-11 NOTE — Progress Notes (Signed)
   Subjective:   Patient ID: Sharon Zavala, female    DOB: October 14, 1940, 82 y.o.   MRN: [id number]  HPI The patient is an 82 YO female coming in for surgical clearance.   PMH, [facility], social history reviewed and updated  Review of Systems  Constitutional: Negative.   HENT: Negative.    Eyes: Negative.   Respiratory:  Negative for cough, chest tightness and shortness of breath.   Cardiovascular:  Negative for chest pain, palpitations and leg swelling.  Gastrointestinal:  Negative for abdominal distention, abdominal pain, constipation, diarrhea, nausea and vomiting.  Musculoskeletal: Negative.   Skin: Negative.   Neurological: Negative.   Psychiatric/Behavioral: Negative.      Objective:  Physical Exam Constitutional:      Appearance: She is well-developed.  HENT:     Head: Normocephalic and atraumatic.  Cardiovascular:     Rate and Rhythm: Normal rate and regular rhythm.  Pulmonary:     Effort: Pulmonary effort is normal. No respiratory distress.     Breath sounds: Normal breath sounds. No wheezing or rales.  Abdominal:     General: Bowel sounds are normal. There is no distension.     Palpations: Abdomen is soft.     Tenderness: There is no abdominal tenderness. There is no rebound.  Musculoskeletal:     Cervical back: Normal range of motion.  Skin:    General: Skin is warm and dry.  Neurological:     Mental Status: She is alert and oriented to person, place, and time.     Coordination: Coordination normal.     Vitals:   03/11/23 1525  BP: (!) 144/80  Pulse: 88  Temp: 98.7 F (37.1 C)  TempSrc: Oral  SpO2: 99%  Weight: 162 lb (73.5 kg)  Height: 5\' 4"  (1.626 m)    EKG: Rate 84, axis normal, interval normal, sinus, no st or t wave changes, no significant change compared to prior 2019   Assessment & Plan:  Visit time 25 minutes in face to face communication with patient and coordination of care, additional 5 minutes spent in record review, coordination or  care, ordering tests, communicating/referring to other healthcare professionals, documenting in medical records all on the same day of the visit for total time 30 minutes spent on the visit.

## 2023-03-11 NOTE — Patient Instructions (Signed)
Try synvisc or orthovisc for the knee if needed.

## 2023-03-14 NOTE — Telephone Encounter (Signed)
Patient was seen by Dr. Okey Dupre 03/11/23 for surgical clearance. She called to check on the status of the form and also provide the fax number. The fax number is [phone]. Patient would like a call back at [phone].

## 2023-03-15 NOTE — Telephone Encounter (Signed)
[facility] is requesting we fax the document again, Providers signature was cut off.

## 2023-03-15 NOTE — Telephone Encounter (Signed)
Patient called back to see if the form has been faxed. She would like a call back with the status at [phone].

## 2023-03-15 NOTE — Telephone Encounter (Signed)
This has been fax back

## 2023-03-15 NOTE — Telephone Encounter (Signed)
Done

## 2023-03-17 DIAGNOSIS — H2512 Age-related nuclear cataract, left eye: Secondary | ICD-10-CM | POA: Diagnosis not present

## 2023-03-17 DIAGNOSIS — H401121 Primary open-angle glaucoma, left eye, mild stage: Secondary | ICD-10-CM | POA: Diagnosis not present

## 2023-03-17 HISTORY — PX: CATARACT EXTRACTION, BILATERAL: SHX1313

## 2023-03-18 DIAGNOSIS — H2511 Age-related nuclear cataract, right eye: Secondary | ICD-10-CM | POA: Diagnosis not present

## 2023-03-24 DIAGNOSIS — H2511 Age-related nuclear cataract, right eye: Secondary | ICD-10-CM | POA: Diagnosis not present

## 2023-04-19 DIAGNOSIS — M1711 Unilateral primary osteoarthritis, right knee: Secondary | ICD-10-CM | POA: Diagnosis not present

## 2023-09-26 ENCOUNTER — Ambulatory Visit (INDEPENDENT_AMBULATORY_CARE_PROVIDER_SITE_OTHER): Payer: Medicare HMO

## 2023-09-26 VITALS — Ht 64.0 in | Wt 162.0 lb

## 2023-09-26 DIAGNOSIS — Z0000 Encounter for general adult medical examination without abnormal findings: Secondary | ICD-10-CM | POA: Diagnosis not present

## 2023-09-26 DIAGNOSIS — Z780 Asymptomatic menopausal state: Secondary | ICD-10-CM

## 2023-09-26 NOTE — Progress Notes (Signed)
 Subjective:   Sharon Zavala is a 83 y.o. female who presents for Medicare Annual (Subsequent) preventive examination.  Visit Complete: Virtual I connected with  Cassandria Anger on 09/26/23 by a audio enabled telemedicine application and verified that I am speaking with the correct person using two identifiers.  Patient Location: Home  Provider Location: Home Office  I discussed the limitations of evaluation and management by telemedicine. The patient expressed understanding and agreed to proceed.  Vital Signs: Because this visit was a virtual/telehealth visit, some criteria may be missing or patient reported. Any vitals not documented were not able to be obtained and vitals that have been documented are patient reported.  Cardiac Risk Factors include: advanced age (>46men, >74 women);hypertension;Other (see comment), Risk factor comments: hx of breast cancer     Objective:    Today's Vitals   09/26/23 1457  Weight: 162 lb (73.5 kg)  Height: 5\' 4"  (1.626 m)   Body mass index is 27.81 kg/m.     09/26/2023    3:20 PM 10/20/2021    3:12 PM 11/07/2017   11:50 AM 10/07/2017    3:07 PM  Advanced Directives  Does Patient Have a Medical Advance Directive? Yes No No No  Type of Estate agent of [place];Living will     Copy of Healthcare Power of Attorney in Chart? No - copy requested     Would patient like information on creating a medical advance directive?  No - Patient declined No - Patient declined No - Patient declined    Current Medications (verified) Outpatient Encounter Medications as of 09/26/2023  Medication Sig   ibuprofen (ADVIL) 600 MG tablet Take 1 tablet (600 mg total) by mouth every 8 (eight) hours as needed.   lisinopril-hydrochlorothiazide (ZESTORETIC) 10-12.5 MG tablet Take 1 tablet by mouth daily.   Multiple Vitamins-Minerals (MULTIVITAMIN ADULT PO) Take by mouth daily.   meloxicam (MOBIC) 15 MG tablet TAKE 1 TABLET EVERY DAY BY ORAL  ROUTE WITH MEALS (Patient not taking: Reported on 09/26/2023)   No facility-administered encounter medications on file as of 09/26/2023.    Allergies (verified) Patient has no known allergies.   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), unspecified site    Routine general medical examination at a health care facility    Unspecified essential hypertension    Past Surgical History:  Procedure Laterality Date   CATARACT EXTRACTION, BILATERAL  03/17/2023   03/24/2023   MASTECTOMY MODIFIED RADICAL  '89 & '97   Bilateral   TONSILLECTOMY     Family History  Problem Relation Age of Onset   Prostate cancer Father 22       deceased   Other Mother 66       natural causes   Breast cancer Sister    Colon cancer Neg Hx    Diabetes Neg Hx    Coronary artery disease Neg Hx    Social History   Socioeconomic History   Marital status: Married    Spouse name: Deniece Portela   Number of children: 1   Years of education: Not on file   Highest education level: Not on file  Occupational History   Occupation: RETIRED  Tobacco Use   Smoking status: Never   Smokeless tobacco: Never  Vaping Use   Vaping status: Never Used  Substance and Sexual Activity   Alcohol use: Yes    Comment: occ   Drug use: No   Sexual activity:  Not Currently  Other Topics Concern   Not on file  Social History Narrative   Cyndia Skeeters EnglishMarried '671 daughter - '68; 1 grandchildLives with husband - independently ; he has made a good recovery from prostate surgery-'09      Lives with husband   Social Drivers of Health   Financial Resource Strain: Low Risk  (09/26/2023)   Overall Financial Resource Strain (CARDIA)    Difficulty of Paying Living Expenses: Not hard at all  Food Insecurity: No Food Insecurity (09/26/2023)   Hunger Vital Sign    Worried About Running Out of Food in the Last Year: Never true    Ran Out of Food in the Last Year: Never true   Transportation Needs: No Transportation Needs (09/26/2023)   PRAPARE - Administrator, Civil Service (Medical): No    Lack of Transportation (Non-Medical): No  Physical Activity: Inactive (09/26/2023)   Exercise Vital Sign    Days of Exercise per Week: 0 days    Minutes of Exercise per Session: 0 min  Stress: No Stress Concern Present (09/26/2023)   [employer] of Occupational Health - Occupational Stress Questionnaire    Feeling of Stress : Not at all  Social Connections: Moderately Isolated (09/26/2023)   Social Connection and Isolation Panel [NHANES]    Frequency of Communication with Friends and Family: More than three times a week    Frequency of Social Gatherings with Friends and Family: More than three times a week    Attends Religious Services: Never    Database administrator or Organizations: No    Attends Engineer, structural: Never    Marital Status: Married    Tobacco Counseling Counseling given: Not Answered   Clinical Intake:  Pre-visit preparation completed: Yes  Pain : No/denies pain     BMI - recorded: 27.81 Nutritional Status: BMI 25 -29 Overweight Nutritional Risks: None Diabetes: No  How often do you need to have someone help you when you read instructions, pamphlets, or other written materials from your doctor or pharmacy?: 1 - Never  Interpreter Needed?: No  Information entered by :: Pearlena Ow, RMA   Activities of Daily Living    09/26/2023    3:14 PM  In your present state of health, do you have any difficulty performing the following activities:  Hearing? 0  Vision? 0  Difficulty concentrating or making decisions? 0  Walking or climbing stairs? 0  Dressing or bathing? 0  Doing errands, shopping? 0  Preparing Food and eating ? N  Using the Toilet? N  In the past six months, have you accidently leaked urine? N  Do you have problems with loss of bowel control? N  Managing your Medications? N  Managing your  Finances? N  Housekeeping or managing your Housekeeping? N    Patient Care Team: Myrlene Broker, MD as PCP - General (Internal Medicine) Zelphia Cairo, MD (Obstetrics and Gynecology) Mathews Robinsons, MD as Consulting Physician (Dermatology) Marzella Schlein., MD (Ophthalmology) Yetta Barre Thomes Dinning, MD as Consulting Physician (Neurosurgery)  Indicate any recent Medical Services you may have received from other than [facility] providers in the past year (date may be approximate).     Assessment:   This is a routine wellness examination for Serafina.  Hearing/Vision screen Hearing Screening - Comments:: Denies hearing difficulties   Vision Screening - Comments:: Denies vision issues. Had cataract surgery    Goals Addressed  This Visit's Progress    Patient Stated       Continue to eat healthy and walk as much as possible. I will continue to do the physical therapy exercises. Enjoy the beach, life, and family.   Finished with PT.        Depression Screen    09/26/2023    3:27 PM 10/25/2022    2:05 PM 10/25/2022    2:03 PM 10/20/2021    2:34 PM 10/15/2020   11:06 AM 10/12/2019    1:17 PM 10/10/2018    1:27 PM  PHQ 2/9 Scores  PHQ - 2 Score 0 0 0 0 0 0 0  PHQ- 9 Score 1 0         Fall Risk    09/26/2023    3:21 PM 03/11/2023    3:25 PM 10/25/2022    2:03 PM 10/20/2021    2:30 PM 10/15/2020   11:06 AM  Fall Risk   Falls in the past year? 0 0 0 1 1  Number falls in past yr: 0 0 0 0 0  Injury with Fall? 0 0 0 1 0  Risk for fall due to : No Fall Risks      Follow up Falls prevention discussed;Falls evaluation completed   Falls evaluation completed     MEDICARE RISK AT HOME: Medicare Risk at Home Any stairs in or around the home?: Yes [facility] home) If so, are there any without handrails?: Yes Home free of loose throw rugs in walkways, pet beds, electrical cords, etc?: Yes Adequate lighting in your home to reduce risk of falls?: Yes Life alert?: No Use  of a cane, walker or w/c?: Yes (sometimes a cane) Grab bars in the bathroom?: [place]) Shower chair or bench in shower?: Yes Elevated toilet seat or a handicapped toilet?: Yes  TIMED UP AND GO:  Was the test performed?  No    Cognitive Function:        09/26/2023    3:23 PM  6CIT Screen  What Year? 0 points  What month? 0 points  What time? 0 points  Count back from 20 0 points  Months in reverse 0 points  Repeat phrase 0 points  Total Score 0 points    Immunizations Immunization History  Administered Date(s) Administered   Fluad Quad(high Dose 65+) 05/20/2022, 05/07/2023   Influenza Whole 05/31/2008, 05/15/2009, 05/02/2010, 06/01/2012   Influenza, High Dose Seasonal PF 05/14/2014, 05/20/2015, 04/19/2016, 04/29/2017, 05/01/2019   Influenza,inj,Quad PF,6+ Mos 05/16/2014   Influenza-Unspecified 05/02/2013, 05/17/2015, 05/16/2018, 04/11/2021   PFIZER(Purple Top)SARS-COV-2 Vaccination 08/23/2019, 09/13/2019, 05/31/2020   Pneumococcal Conjugate-13 09/24/2013   Pneumococcal Polysaccharide-23 01/27/2009   Td 09/18/2010   Zoster Recombinant(Shingrix) 06/25/2019, 11/08/2019   Zoster, Live 09/18/2010    TDAP status: Due, Education has been provided regarding the importance of this vaccine. Advised may receive this vaccine at local pharmacy or Health Dept. Aware to provide a copy of the vaccination record if obtained from local pharmacy or Health Dept. Verbalized acceptance and understanding.  Flu Vaccine status: Up to date  Pneumococcal vaccine status: Up to date  Covid-19 vaccine status: Completed vaccines  Qualifies for Shingles Vaccine? Yes   Zostavax completed Yes   Shingrix Completed?: Yes  Screening Tests Health Maintenance  Topic Date Due   DTaP/Tdap/Td (2 - Tdap) 09/18/2020   COVID-19 Vaccine (4 - 2024-25 season) 04/03/2023   Medicare Annual Wellness (AWV)  09/25/2024   Pneumonia Vaccine 16+ Years old  Completed  INFLUENZA VACCINE  Completed    DEXA SCAN  Completed   Zoster Vaccines- Shingrix  Completed   HPV VACCINES  Aged Out   Colonoscopy  Discontinued    Health Maintenance  Health Maintenance Due  Topic Date Due   DTaP/Tdap/Td (2 - Tdap) 09/18/2020   COVID-19 Vaccine (4 - 2024-25 season) 04/03/2023    Colorectal cancer screening: Type of screening: Cologuard. Completed 11/21/2021. Repeat every 3 years  Mammogram status: No longer required due to mastectomy. S/P bilateral mastectomy   Bone Density status: Ordered 09/26/2023. Pt provided with contact info and advised to call to schedule appt.  Lung Cancer Screening: (Low Dose CT Chest recommended if Age 67-80 years, 20 pack-year currently smoking OR have quit w/in 15years.) does not qualify.   Lung Cancer Screening Referral: N/A  Additional Screening:  Hepatitis C Screening: does not qualify;  Vision Screening: Recommended annual ophthalmology exams for early detection of glaucoma and other disorders of the eye. Is the patient up to date with their annual eye exam?  Yes  Who is the provider or what is the name of the office in which the patient attends annual eye exams? Dr. Arnold [facility]) If pt is not established with a provider, would they like to be referred to a provider to establish care? No .   Dental Screening: Recommended annual dental exams for proper oral hygiene   Community Resource Referral / Chronic Care Management: CRR required this visit?  No   CCM required this visit?  No     Plan:     I have personally reviewed and noted the following in the patient's chart:   Medical and social history Use of alcohol, tobacco or illicit drugs  Current medications and supplements including opioid prescriptions. Patient is not currently taking opioid prescriptions. Functional ability and status Nutritional status Physical activity Advanced directives List of other physicians Hospitalizations, surgeries, and ER visits in previous 12  months Vitals Screenings to include cognitive, depression, and falls Referrals and appointments  In addition, I have reviewed and discussed with patient certain preventive protocols, quality metrics, and best practice recommendations. A written personalized care plan for preventive services as well as general preventive health recommendations were provided to patient.     Emerson Barretto L Yaretsi Humphres, CMA   09/26/2023   After Visit Summary: (MyChart) Due to this being a telephonic visit, the after visit summary with patients personalized plan was offered to patient via MyChart   Nurse Notes: Patient is due for a DEXA and order has been placed.  She is also due for a Tdap and would like to get it during her office visit.  Patient had no other concerns to address today.

## 2023-09-26 NOTE — Patient Instructions (Signed)
 Sharon Zavala , Thank you for taking time to come for your Medicare Wellness Visit. I appreciate your ongoing commitment to your health goals. Please review the following plan we discussed and let me know if I can assist you in the future.   Referrals/Orders/Follow-Ups/Clinician Recommendations: It was nice talking to you today.  You are due for a Tdap.  You have an order for:   [x]   Bone Density     Please call for appointment:  [facility] [address] [place], [place] [address] [phone]   Make sure to wear two-piece clothing.  No lotions, powders, or deodorants the day of the appointment. Make sure to bring picture ID and insurance card.  Bring list of medications you are currently taking including any supplements.   Schedule your [place] screening mammogram through MyChart!   Log into your MyChart account.  Go to 'Visit' (or 'Appointments' if on mobile App) --> Schedule an Appointment  Under 'Select a Reason for Visit' choose the Mammogram Screening option.  Complete the pre-visit questions and select the time and place that best fits your schedule.    This is a list of the screening recommended for you and due dates:  Health Maintenance  Topic Date Due   DTaP/Tdap/Td vaccine (2 - Tdap) 09/18/2020   COVID-19 Vaccine (4 - 2024-25 season) 04/03/2023   Medicare Annual Wellness Visit  09/25/2024   Pneumonia Vaccine  Completed   Flu Shot  Completed   DEXA scan (bone density measurement)  Completed   Zoster (Shingles) Vaccine  Completed   HPV Vaccine  Aged Out   Colon Cancer Screening  Discontinued    Advanced directives: (Copy Requested) Please bring a copy of your health care power of attorney and living will to the office to be added to your chart at your convenience.  Next Medicare Annual Wellness Visit scheduled for next year: Yes

## 2023-10-24 DIAGNOSIS — Z85828 Personal history of other malignant neoplasm of skin: Secondary | ICD-10-CM | POA: Diagnosis not present

## 2023-10-24 DIAGNOSIS — L820 Inflamed seborrheic keratosis: Secondary | ICD-10-CM | POA: Diagnosis not present

## 2023-10-24 DIAGNOSIS — D1801 Hemangioma of skin and subcutaneous tissue: Secondary | ICD-10-CM | POA: Diagnosis not present

## 2023-10-24 DIAGNOSIS — D2362 Other benign neoplasm of skin of left upper limb, including shoulder: Secondary | ICD-10-CM | POA: Diagnosis not present

## 2023-10-24 DIAGNOSIS — L814 Other melanin hyperpigmentation: Secondary | ICD-10-CM | POA: Diagnosis not present

## 2023-10-24 DIAGNOSIS — D2361 Other benign neoplasm of skin of right upper limb, including shoulder: Secondary | ICD-10-CM | POA: Diagnosis not present

## 2023-10-24 DIAGNOSIS — L570 Actinic keratosis: Secondary | ICD-10-CM | POA: Diagnosis not present

## 2023-10-24 DIAGNOSIS — L821 Other seborrheic keratosis: Secondary | ICD-10-CM | POA: Diagnosis not present

## 2023-10-26 ENCOUNTER — Encounter: Payer: Self-pay | Admitting: Internal Medicine

## 2023-10-26 ENCOUNTER — Ambulatory Visit (INDEPENDENT_AMBULATORY_CARE_PROVIDER_SITE_OTHER): Payer: Medicare HMO | Admitting: Internal Medicine

## 2023-10-26 VITALS — BP 140/80 | HR 57 | Temp 97.8°F | Ht 64.0 in | Wt 164.0 lb

## 2023-10-26 DIAGNOSIS — M1990 Unspecified osteoarthritis, unspecified site: Secondary | ICD-10-CM | POA: Diagnosis not present

## 2023-10-26 DIAGNOSIS — Z0000 Encounter for general adult medical examination without abnormal findings: Secondary | ICD-10-CM

## 2023-10-26 DIAGNOSIS — I10 Essential (primary) hypertension: Secondary | ICD-10-CM | POA: Diagnosis not present

## 2023-10-26 LAB — CBC
HCT: 44.8 % (ref 36.0–46.0)
Hemoglobin: 14.9 g/dL (ref 12.0–15.0)
MCHC: 33.1 g/dL (ref 30.0–36.0)
MCV: 95.7 fl (ref 78.0–100.0)
Platelets: 335 10*3/uL (ref 150.0–400.0)
RBC: 4.69 Mil/uL (ref 3.87–5.11)
RDW: 13.7 % (ref 11.5–15.5)
WBC: 6.8 10*3/uL (ref 4.0–10.5)

## 2023-10-26 LAB — COMPREHENSIVE METABOLIC PANEL
ALT: 15 U/L (ref 0–35)
AST: 19 U/L (ref 0–37)
Albumin: 4.2 g/dL (ref 3.5–5.2)
Alkaline Phosphatase: 89 U/L (ref 39–117)
BUN: 21 mg/dL (ref 6–23)
CO2: 29 meq/L (ref 19–32)
Calcium: 9.4 mg/dL (ref 8.4–10.5)
Chloride: 104 meq/L (ref 96–112)
Creatinine, Ser: 0.85 mg/dL (ref 0.40–1.20)
GFR: 64.28 mL/min (ref >60.00)
Glucose, Bld: 90 mg/dL (ref 70–99)
Potassium: 4.2 meq/L (ref 3.5–5.1)
Sodium: 140 meq/L (ref 135–145)
Total Bilirubin: 0.5 mg/dL (ref 0.2–1.2)
Total Protein: 6.8 g/dL (ref 6.0–8.3)

## 2023-10-26 LAB — LIPID PANEL
Cholesterol: 191 mg/dL (ref 0–200)
HDL: 67.4 mg/dL (ref >39.00)
LDL Cholesterol: 103 mg/dL — ABNORMAL HIGH (ref 0–99)
NonHDL: 123.92
Total CHOL/HDL Ratio: 3
Triglycerides: 106 mg/dL (ref 0.0–149.0)
VLDL: 21.2 mg/dL (ref 0.0–40.0)

## 2023-10-26 MED ORDER — LISINOPRIL-HYDROCHLOROTHIAZIDE 10-12.5 MG PO TABS: 1.0000 | ORAL_TABLET | Freq: Every day | ORAL | 3 refills | Status: AC

## 2023-10-26 MED ORDER — MELOXICAM 15 MG PO TABS: 15.0000 mg | ORAL_TABLET | Freq: Every day | ORAL | 3 refills | Status: AC

## 2023-10-26 NOTE — Progress Notes (Unsigned)
   Subjective:   Patient ID: Sharon Zavala, female    DOB: 1940/12/16, 83 y.o.   MRN: [id number]  HPI The patient is here for physical.  Wants labs mailed to: [address] [place], [place] [address]  PMH, [facility], social history reviewed and updated  Review of Systems  Constitutional: Negative.   HENT: Negative.    Eyes: Negative.   Respiratory:  Negative for cough, chest tightness and shortness of breath.   Cardiovascular:  Negative for chest pain, palpitations and leg swelling.  Gastrointestinal:  Negative for abdominal distention, abdominal pain, constipation, diarrhea, nausea and vomiting.  Musculoskeletal: Negative.   Skin: Negative.   Neurological: Negative.   Psychiatric/Behavioral: Negative.      Objective:  Physical Exam Constitutional:      Appearance: She is well-developed.  HENT:     Head: Normocephalic and atraumatic.  Cardiovascular:     Rate and Rhythm: Normal rate and regular rhythm.  Pulmonary:     Effort: Pulmonary effort is normal. No respiratory distress.     Breath sounds: Normal breath sounds. No wheezing or rales.  Abdominal:     General: Bowel sounds are normal. There is no distension.     Palpations: Abdomen is soft.     Tenderness: There is no abdominal tenderness. There is no rebound.  Musculoskeletal:     Cervical back: Normal range of motion.  Skin:    General: Skin is warm and dry.  Neurological:     Mental Status: She is alert and oriented to person, place, and time.     Coordination: Coordination normal.     Vitals:   10/26/23 1036 10/26/23 1044  BP: (!) 148/80 (!) 140/80  Pulse: (!) 57   Temp: 97.8 F (36.6 C)   TempSrc: Oral   SpO2: 97%   Weight: 164 lb (74.4 kg)   Height: 5\' 4"  (1.626 m)     Assessment & Plan:

## 2023-10-26 NOTE — Patient Instructions (Addendum)
 Dr Charlann Boxer and [facility]inte[facility] are both good with knees in town.

## 2023-10-27 ENCOUNTER — Encounter: Payer: Self-pay | Admitting: Internal Medicine

## 2023-10-27 DIAGNOSIS — M1990 Unspecified osteoarthritis, unspecified site: Secondary | ICD-10-CM | POA: Insufficient documentation

## 2023-10-27 NOTE — Assessment & Plan Note (Signed)
 Rx meloxicam 15 mg daily to take as needed for pain.

## 2023-10-27 NOTE — Assessment & Plan Note (Signed)
 Flu shot up to date. Pneumonia complete. Shingrix complete. Tetanus due at pharmacy. Colonoscopy aged out. Mammogram not indicated, pap smear aged out and dexa complete. Counseled about sun safety and mole surveillance. Counseled about the dangers of distracted driving. Given 10 year screening recommendations.

## 2023-10-27 NOTE — Assessment & Plan Note (Signed)
 Checking CMP and adjust as needed. Taking lisinopril/hydrochlorothiazide 10/12.5 mg daily adjust as needed.

## 2023-11-02 DIAGNOSIS — H5340 Unspecified visual field defects: Secondary | ICD-10-CM | POA: Diagnosis not present

## 2023-11-02 DIAGNOSIS — H401110 Primary open-angle glaucoma, right eye, stage unspecified: Secondary | ICD-10-CM | POA: Diagnosis not present

## 2023-11-02 DIAGNOSIS — H401121 Primary open-angle glaucoma, left eye, mild stage: Secondary | ICD-10-CM | POA: Diagnosis not present

## 2023-11-07 DIAGNOSIS — Z1379 Encounter for other screening for genetic and chromosomal anomalies: Secondary | ICD-10-CM | POA: Diagnosis not present

## 2023-11-07 DIAGNOSIS — H40019 Open angle with borderline findings, low risk, unspecified eye: Secondary | ICD-10-CM | POA: Diagnosis not present

## 2023-11-26 ENCOUNTER — Other Ambulatory Visit: Payer: Self-pay | Admitting: Internal Medicine

## 2023-11-30 ENCOUNTER — Other Ambulatory Visit: Payer: Self-pay | Admitting: Internal Medicine

## 2023-11-30 NOTE — Telephone Encounter (Signed)
 Copied from CRM [id number]. Topic: Clinical - Medication Refill >> Nov 30, 2023 10:47 AM Keitha Pata L wrote: Most Recent Primary Care Visit:  Provider: Bambi Lever A  Department: [facility]  Visit Type: PHYSICAL  Date: 10/26/2023  Medication:lisinopril -hydrochlorothiazide  (ZESTORETIC ) 10-12.5 MG tablet  Has the patient contacted their pharmacy? Yes (Agent: If no, request that the patient contact the pharmacy for the refill. If patient does not wish to contact the pharmacy document the reason why and proceed with request.) (Agent: If yes, when and what did the pharmacy advise?)  Is this the correct pharmacy for this prescription? Yes If no, delete pharmacy and type the correct one.  This is the patient's preferred pharmacy:    [id number] - [place], [place] - [address] [facility] [facility] [address] [place] [address] Phone: [phone] Fax: [phone]   Has the prescription been filled recently? No  Is the patient out of the medication? Yes  Has the patient been seen for an appointment in the last year OR does the patient have an upcoming appointment? Yes  Can we respond through MyChart? No  Agent: Please be advised that Rx refills may take up to 3 business days. We ask that you follow-up with your pharmacy.

## 2024-02-06 DIAGNOSIS — G35 Multiple sclerosis: Secondary | ICD-10-CM | POA: Diagnosis not present

## 2024-02-23 ENCOUNTER — Telehealth: Payer: Self-pay

## 2024-02-23 NOTE — Telephone Encounter (Signed)
 Copied from CRM [id number]. Topic: Clinical - Medical Advice >> Feb 23, 2024 11:24 AM Harlene ORN wrote: Reason for CRM: Patient called. she needs a bone density test, but the next available for an appointment for a bone density is in February. Please advise.

## 2024-02-25 DIAGNOSIS — R700 Elevated erythrocyte sedimentation rate: Secondary | ICD-10-CM | POA: Diagnosis not present

## 2024-05-03 DIAGNOSIS — H524 Presbyopia: Secondary | ICD-10-CM | POA: Diagnosis not present

## 2024-05-03 DIAGNOSIS — H52223 Regular astigmatism, bilateral: Secondary | ICD-10-CM | POA: Diagnosis not present

## 2024-05-18 ENCOUNTER — Encounter: Payer: Self-pay | Admitting: *Deleted

## 2024-05-18 NOTE — Progress Notes (Signed)
 Sharon Zavala                                          MRN: [id number]   05/18/2024   The [facility] Quality Team Specialist reviewed this patient medical record for the purposes of chart review for care gap closure. The following were reviewed: chart review for care gap closure-controlling blood pressure.    [facility] Quality Team

## 2024-05-23 DIAGNOSIS — H04123 Dry eye syndrome of bilateral lacrimal glands: Secondary | ICD-10-CM | POA: Diagnosis not present

## 2024-05-23 DIAGNOSIS — H401121 Primary open-angle glaucoma, left eye, mild stage: Secondary | ICD-10-CM | POA: Diagnosis not present

## 2024-05-23 DIAGNOSIS — H43812 Vitreous degeneration, left eye: Secondary | ICD-10-CM | POA: Diagnosis not present

## 2024-05-23 DIAGNOSIS — H40021 Open angle with borderline findings, high risk, right eye: Secondary | ICD-10-CM | POA: Diagnosis not present

## 2024-07-27 NOTE — Progress Notes (Signed)
 Sharon Zavala                                          MRN: [id number]   07/27/2024   The [facility] Quality Team Specialist reviewed this patient medical record for the purposes of chart review for care gap closure. The following were reviewed: chart review for care gap closure-controlling blood pressure.    [facility] Quality Team

## 2024-08-28 NOTE — Progress Notes (Signed)
 Sharon Zavala                                          MRN: [id number]   08/28/2024   The [facility] Quality Team Specialist reviewed this patient medical record for the purposes of chart review for care gap closure. The following were reviewed: chart review for care gap closure-controlling blood pressure.    [facility] Quality Team

## 2024-09-26 ENCOUNTER — Ambulatory Visit: Payer: Medicare HMO

## 2024-10-29 ENCOUNTER — Encounter: Admitting: Internal Medicine
